# Patient Record
Sex: Female | Born: 1985 | Race: White | Hispanic: No | Marital: Married | Smoking: Former smoker
Health system: Southern US, Community
[De-identification: ages and names within clinical notes are randomized; demographics above are authoritative.]

## PROBLEM LIST (undated history)

## (undated) DIAGNOSIS — Z789 Other specified health status: Secondary | ICD-10-CM

## (undated) HISTORY — PX: HERNIA REPAIR: SHX51

## (undated) HISTORY — DX: Other specified health status: Z78.9

---
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
## 2012-11-26 ENCOUNTER — Other Ambulatory Visit: Payer: Self-pay | Admitting: Dermatology

## 2015-10-11 DIAGNOSIS — Z23 Encounter for immunization: Secondary | ICD-10-CM | POA: Diagnosis not present

## 2015-11-12 DIAGNOSIS — Z3201 Encounter for pregnancy test, result positive: Secondary | ICD-10-CM | POA: Diagnosis not present

## 2015-11-19 DIAGNOSIS — Z3201 Encounter for pregnancy test, result positive: Secondary | ICD-10-CM | POA: Diagnosis not present

## 2015-11-26 DIAGNOSIS — Z3689 Encounter for other specified antenatal screening: Secondary | ICD-10-CM | POA: Diagnosis not present

## 2015-11-26 DIAGNOSIS — Z3401 Encounter for supervision of normal first pregnancy, first trimester: Secondary | ICD-10-CM | POA: Diagnosis not present

## 2015-11-26 LAB — OB RESULTS CONSOLE ABO/RH: RH Type: POSITIVE

## 2015-11-26 LAB — OB RESULTS CONSOLE HIV ANTIBODY (ROUTINE TESTING): HIV: NONREACTIVE

## 2015-11-26 LAB — OB RESULTS CONSOLE HEPATITIS B SURFACE ANTIGEN: Hepatitis B Surface Ag: NEGATIVE

## 2015-11-26 LAB — OB RESULTS CONSOLE ANTIBODY SCREEN: Antibody Screen: NEGATIVE

## 2015-11-26 LAB — OB RESULTS CONSOLE RPR: RPR: NONREACTIVE

## 2015-11-26 LAB — OB RESULTS CONSOLE RUBELLA ANTIBODY, IGM: Rubella: IMMUNE

## 2015-12-11 DIAGNOSIS — Z3682 Encounter for antenatal screening for nuchal translucency: Secondary | ICD-10-CM | POA: Diagnosis not present

## 2015-12-11 DIAGNOSIS — Z3689 Encounter for other specified antenatal screening: Secondary | ICD-10-CM | POA: Diagnosis not present

## 2015-12-11 DIAGNOSIS — Z3491 Encounter for supervision of normal pregnancy, unspecified, first trimester: Secondary | ICD-10-CM | POA: Diagnosis not present

## 2015-12-11 DIAGNOSIS — Z3401 Encounter for supervision of normal first pregnancy, first trimester: Secondary | ICD-10-CM | POA: Diagnosis not present

## 2015-12-11 DIAGNOSIS — Z36 Encounter for antenatal screening for chromosomal anomalies: Secondary | ICD-10-CM | POA: Diagnosis not present

## 2015-12-11 LAB — OB RESULTS CONSOLE GC/CHLAMYDIA
Chlamydia: NEGATIVE
Gonorrhea: NEGATIVE

## 2015-12-21 DIAGNOSIS — A630 Anogenital (venereal) warts: Secondary | ICD-10-CM | POA: Diagnosis not present

## 2015-12-21 DIAGNOSIS — N898 Other specified noninflammatory disorders of vagina: Secondary | ICD-10-CM | POA: Diagnosis not present

## 2015-12-21 DIAGNOSIS — Z3682 Encounter for antenatal screening for nuchal translucency: Secondary | ICD-10-CM | POA: Diagnosis not present

## 2016-01-18 DIAGNOSIS — Z361 Encounter for antenatal screening for raised alphafetoprotein level: Secondary | ICD-10-CM | POA: Diagnosis not present

## 2016-01-22 DIAGNOSIS — B009 Herpesviral infection, unspecified: Secondary | ICD-10-CM | POA: Diagnosis not present

## 2016-02-03 DIAGNOSIS — Z3401 Encounter for supervision of normal first pregnancy, first trimester: Secondary | ICD-10-CM | POA: Diagnosis not present

## 2016-02-03 DIAGNOSIS — Z363 Encounter for antenatal screening for malformations: Secondary | ICD-10-CM | POA: Diagnosis not present

## 2016-02-24 DIAGNOSIS — Z3A20 20 weeks gestation of pregnancy: Secondary | ICD-10-CM | POA: Diagnosis not present

## 2016-02-24 DIAGNOSIS — O26892 Other specified pregnancy related conditions, second trimester: Secondary | ICD-10-CM | POA: Diagnosis not present

## 2016-03-03 DIAGNOSIS — A630 Anogenital (venereal) warts: Secondary | ICD-10-CM | POA: Diagnosis not present

## 2016-04-06 DIAGNOSIS — O4402 Placenta previa specified as without hemorrhage, second trimester: Secondary | ICD-10-CM | POA: Diagnosis not present

## 2016-04-06 DIAGNOSIS — Z3689 Encounter for other specified antenatal screening: Secondary | ICD-10-CM | POA: Diagnosis not present

## 2016-04-06 DIAGNOSIS — Z3A26 26 weeks gestation of pregnancy: Secondary | ICD-10-CM | POA: Diagnosis not present

## 2016-04-13 DIAGNOSIS — Z3482 Encounter for supervision of other normal pregnancy, second trimester: Secondary | ICD-10-CM | POA: Diagnosis not present

## 2016-04-13 DIAGNOSIS — Z3483 Encounter for supervision of other normal pregnancy, third trimester: Secondary | ICD-10-CM | POA: Diagnosis not present

## 2016-04-19 DIAGNOSIS — O4403 Placenta previa specified as without hemorrhage, third trimester: Secondary | ICD-10-CM | POA: Diagnosis not present

## 2016-04-19 DIAGNOSIS — Z3689 Encounter for other specified antenatal screening: Secondary | ICD-10-CM | POA: Diagnosis not present

## 2016-04-19 DIAGNOSIS — Z3A28 28 weeks gestation of pregnancy: Secondary | ICD-10-CM | POA: Diagnosis not present

## 2016-04-19 DIAGNOSIS — Z23 Encounter for immunization: Secondary | ICD-10-CM | POA: Diagnosis not present

## 2016-05-04 DIAGNOSIS — Z3A30 30 weeks gestation of pregnancy: Secondary | ICD-10-CM | POA: Diagnosis not present

## 2016-05-04 DIAGNOSIS — O4403 Placenta previa specified as without hemorrhage, third trimester: Secondary | ICD-10-CM | POA: Diagnosis not present

## 2016-05-17 DIAGNOSIS — Z3A32 32 weeks gestation of pregnancy: Secondary | ICD-10-CM | POA: Diagnosis not present

## 2016-05-17 DIAGNOSIS — O4403 Placenta previa specified as without hemorrhage, third trimester: Secondary | ICD-10-CM | POA: Diagnosis not present

## 2016-06-02 DIAGNOSIS — Z3A34 34 weeks gestation of pregnancy: Secondary | ICD-10-CM | POA: Diagnosis not present

## 2016-06-02 DIAGNOSIS — O4403 Placenta previa specified as without hemorrhage, third trimester: Secondary | ICD-10-CM | POA: Diagnosis not present

## 2016-06-09 DIAGNOSIS — Z3403 Encounter for supervision of normal first pregnancy, third trimester: Secondary | ICD-10-CM | POA: Diagnosis not present

## 2016-06-09 DIAGNOSIS — O4403 Placenta previa specified as without hemorrhage, third trimester: Secondary | ICD-10-CM | POA: Diagnosis not present

## 2016-06-09 DIAGNOSIS — Z3685 Encounter for antenatal screening for Streptococcus B: Secondary | ICD-10-CM | POA: Diagnosis not present

## 2016-06-09 DIAGNOSIS — Z3A35 35 weeks gestation of pregnancy: Secondary | ICD-10-CM | POA: Diagnosis not present

## 2016-06-16 DIAGNOSIS — Z3A36 36 weeks gestation of pregnancy: Secondary | ICD-10-CM | POA: Diagnosis not present

## 2016-06-16 DIAGNOSIS — O4403 Placenta previa specified as without hemorrhage, third trimester: Secondary | ICD-10-CM | POA: Diagnosis not present

## 2016-06-24 DIAGNOSIS — O4403 Placenta previa specified as without hemorrhage, third trimester: Secondary | ICD-10-CM | POA: Diagnosis not present

## 2016-06-24 DIAGNOSIS — Z3A37 37 weeks gestation of pregnancy: Secondary | ICD-10-CM | POA: Diagnosis not present

## 2016-06-29 DIAGNOSIS — Z3A38 38 weeks gestation of pregnancy: Secondary | ICD-10-CM | POA: Diagnosis not present

## 2016-06-29 DIAGNOSIS — O4403 Placenta previa specified as without hemorrhage, third trimester: Secondary | ICD-10-CM | POA: Diagnosis not present

## 2016-07-06 ENCOUNTER — Inpatient Hospital Stay (HOSPITAL_COMMUNITY)
Admission: AD | Admit: 2016-07-06 | Discharge: 2016-07-10 | Disposition: A | Discharge status: Home / Self Care | Payer: BLUE CROSS/BLUE SHIELD | Source: Ambulatory Visit | Attending: Obstetrics and Gynecology | Admitting: Obstetrics and Gynecology

## 2016-07-06 ENCOUNTER — Other Ambulatory Visit: Payer: Self-pay | Admitting: Obstetrics and Gynecology

## 2016-07-06 ENCOUNTER — Inpatient Hospital Stay (HOSPITAL_COMMUNITY)
Admission: AD | Admit: 2016-07-06 | Discharge: 2016-07-06 | DRG: 765 | Disposition: A | Discharge status: Home / Self Care | Payer: BLUE CROSS/BLUE SHIELD | Source: Ambulatory Visit | Attending: Obstetrics and Gynecology | Admitting: Obstetrics and Gynecology

## 2016-07-06 DIAGNOSIS — Z880 Allergy status to penicillin: Secondary | ICD-10-CM | POA: Diagnosis not present

## 2016-07-06 DIAGNOSIS — Z3A39 39 weeks gestation of pregnancy: Secondary | ICD-10-CM

## 2016-07-06 DIAGNOSIS — O99214 Obesity complicating childbirth: Secondary | ICD-10-CM | POA: Diagnosis not present

## 2016-07-06 DIAGNOSIS — D500 Iron deficiency anemia secondary to blood loss (chronic): Secondary | ICD-10-CM | POA: Diagnosis not present

## 2016-07-06 DIAGNOSIS — Z23 Encounter for immunization: Secondary | ICD-10-CM | POA: Diagnosis not present

## 2016-07-06 DIAGNOSIS — Z3493 Encounter for supervision of normal pregnancy, unspecified, third trimester: Secondary | ICD-10-CM | POA: Diagnosis not present

## 2016-07-06 DIAGNOSIS — Z6841 Body Mass Index (BMI) 40.0 and over, adult: Secondary | ICD-10-CM

## 2016-07-06 DIAGNOSIS — O9902 Anemia complicating childbirth: Principal | ICD-10-CM | POA: Diagnosis present

## 2016-07-06 DIAGNOSIS — Z3490 Encounter for supervision of normal pregnancy, unspecified, unspecified trimester: Secondary | ICD-10-CM

## 2016-07-06 LAB — TYPE AND SCREEN
ABO/RH(D): O POS
Antibody Screen: NEGATIVE
Antibody Screen: NEGATIVE
Antibody Screen: NEGATIVE
Antibody Screen: NEGATIVE
Antibody Screen: NEGATIVE

## 2016-07-06 LAB — CBC
HCT: 39.2 % (ref 36.0–46.0)
Hemoglobin: 13.1 g/dL (ref 12.0–15.0)
MCH: 33.2 pg (ref 26.0–34.0)
MCHC: 33.4 g/dL (ref 30.0–36.0)
MCV: 99.2 fL (ref 78.0–100.0)
Platelets: 185 10*3/uL (ref 150–400)
RBC: 3.95 MIL/uL (ref 3.87–5.11)
RDW: 14.2 % (ref 11.5–15.5)
WBC: 12.1 10*3/uL — ABNORMAL HIGH (ref 4.0–10.5)

## 2016-07-06 LAB — OB RESULTS CONSOLE GBS: GBS: POSITIVE

## 2016-07-06 LAB — POCT FERN TEST: POCT Fern Test: POSITIVE

## 2016-07-06 MED ORDER — LACTATED RINGERS IV SOLN
INTRAVENOUS | Status: DC
  Administered 2016-07-06 – 2016-07-07 (×5): via INTRAVENOUS

## 2016-07-06 MED ORDER — FLEET ENEMA 7-19 GM/118ML RE ENEM: 1.0000 | ENEMA | RECTAL | Status: DC | PRN

## 2016-07-06 MED ORDER — SOD CITRATE-CITRIC ACID 500-334 MG/5ML PO SOLN
30.0000 mL | ORAL | Status: DC | PRN
Start: 2016-07-06 — End: 2016-07-06

## 2016-07-06 MED ORDER — LIDOCAINE HCL (PF) 1 % IJ SOLN: 30.0000 mL | INTRAMUSCULAR | Status: DC | PRN

## 2016-07-06 MED ORDER — CEFAZOLIN SODIUM-DEXTROSE 1-4 GM/50ML-% IV SOLN: 1.0000 g | Freq: Three times a day (TID) | INTRAVENOUS | Status: DC

## 2016-07-06 MED ORDER — TERBUTALINE SULFATE 1 MG/ML IJ SOLN: 0.2500 mg | Freq: Once | INTRAMUSCULAR | Status: DC | PRN

## 2016-07-06 MED ORDER — OXYTOCIN BOLUS FROM INFUSION: 500.0000 mL | Freq: Once | INTRAVENOUS | Status: DC

## 2016-07-06 MED ORDER — OXYCODONE-ACETAMINOPHEN 5-325 MG PO TABS: 2.0000 | ORAL_TABLET | ORAL | Status: DC | PRN

## 2016-07-06 MED ORDER — CEFAZOLIN SODIUM-DEXTROSE 2-4 GM/100ML-% IV SOLN
2.0000 g | Freq: Once | INTRAVENOUS | Status: AC
  Administered 2016-07-07: 2 g via INTRAVENOUS
  Filled 2016-07-06: qty 100

## 2016-07-06 MED ORDER — ONDANSETRON HCL 4 MG/2ML IJ SOLN: 4.0000 mg | Freq: Four times a day (QID) | INTRAMUSCULAR | Status: DC | PRN

## 2016-07-06 MED ORDER — SOD CITRATE-CITRIC ACID 500-334 MG/5ML PO SOLN
30.0000 mL | ORAL | Status: DC | PRN
  Administered 2016-07-07: 30 mL via ORAL
  Filled 2016-07-06: qty 15

## 2016-07-06 MED ORDER — OXYTOCIN 40 UNITS IN LACTATED RINGERS INFUSION - SIMPLE MED: 2.5000 [IU]/h | INTRAVENOUS | Status: DC

## 2016-07-06 MED ORDER — LACTATED RINGERS IV SOLN: 500.0000 mL | INTRAVENOUS | Status: DC | PRN

## 2016-07-06 MED ORDER — SOD CITRATE-CITRIC ACID 500-334 MG/5ML PO SOLN: 30.0000 mL | ORAL | Status: DC | PRN

## 2016-07-06 MED ORDER — OXYCODONE-ACETAMINOPHEN 5-325 MG PO TABS
2.0000 | ORAL_TABLET | ORAL | Status: DC | PRN
Start: 2016-07-06 — End: 2016-07-06

## 2016-07-06 MED ORDER — ACETAMINOPHEN 325 MG PO TABS: 650.0000 mg | ORAL_TABLET | ORAL | Status: DC | PRN

## 2016-07-06 MED ORDER — TERBUTALINE SULFATE 1 MG/ML IJ SOLN
0.2500 mg | Freq: Once | INTRAMUSCULAR | Status: DC | PRN
  Filled 2016-07-06: qty 1

## 2016-07-06 MED ORDER — LACTATED RINGERS IV SOLN
500.0000 mL | INTRAVENOUS | Status: DC | PRN
  Administered 2016-07-07: 500 mL via INTRAVENOUS

## 2016-07-06 MED ORDER — OXYTOCIN 40 UNITS IN LACTATED RINGERS INFUSION - SIMPLE MED: 1.0000 m[IU]/min | INTRAVENOUS | Status: DC

## 2016-07-06 MED ORDER — OXYTOCIN 40 UNITS IN LACTATED RINGERS INFUSION - SIMPLE MED
INTRAVENOUS | Status: AC
  Filled 2016-07-06: qty 1000

## 2016-07-06 MED ORDER — OXYCODONE-ACETAMINOPHEN 5-325 MG PO TABS: 1.0000 | ORAL_TABLET | ORAL | Status: DC | PRN

## 2016-07-06 MED ORDER — CEFAZOLIN SODIUM-DEXTROSE 2-4 GM/100ML-% IV SOLN: 2.0000 g | Freq: Once | INTRAVENOUS | Status: DC

## 2016-07-06 MED ORDER — CEFAZOLIN SODIUM-DEXTROSE 1-4 GM/50ML-% IV SOLN
1.0000 g | Freq: Three times a day (TID) | INTRAVENOUS | Status: DC
  Administered 2016-07-07: 1 g via INTRAVENOUS
  Filled 2016-07-06 (×2): qty 50

## 2016-07-06 MED ORDER — CLINDAMYCIN PHOSPHATE 900 MG/50ML IV SOLN: 900.0000 mg | Freq: Three times a day (TID) | INTRAVENOUS | Status: DC

## 2016-07-06 MED ORDER — LACTATED RINGERS IV SOLN: INTRAVENOUS | Status: DC

## 2016-07-06 MED ORDER — OXYCODONE-ACETAMINOPHEN 5-325 MG PO TABS
1.0000 | ORAL_TABLET | ORAL | Status: DC | PRN
Start: 2016-07-06 — End: 2016-07-06

## 2016-07-06 MED ORDER — LACTATED RINGERS IV SOLN
INTRAVENOUS | Status: DC
  Administered 2016-07-06: 22:00:00 via INTRAVENOUS

## 2016-07-06 MED ORDER — OXYTOCIN 40 UNITS IN LACTATED RINGERS INFUSION - SIMPLE MED
1.0000 m[IU]/min | INTRAVENOUS | Status: DC
  Administered 2016-07-06: 1 m[IU]/min via INTRAVENOUS

## 2016-07-06 NOTE — Progress Notes (Addendum)
G1@ [redacted] wksga. Presents to triage for r/o SROM @ 1900. Clear. Denies bleeding. + FM. EFM applied. SVE 1.5/thick/ballotable  2129: Provider notified. Report status of pt given. Orders received to admit pt with routine orders. GBS+ with amoxicillin allergy. Ordered for Clindamycin 900 mg q8hrs.   2142: Birthing charge nurse notified. Report status of pt given. Room assigned to 168  2150: Labs and IV started.   2200: pt to birthing suite via wheelchair taken by RN.   *Web designerBirthing Charge nurse made aware of merge of two records on this patient and aware pt is G1P0. Informed that registrar aware of issue and will resolve record mix up tomorrow with CC technician.

## 2016-07-06 NOTE — H&P (Signed)
Megan Bowman is a 31 y.o. female presenting for SROM at [id number]. OB History    Gravida Para Term Preterm AB Living   2 1 1  0 0 0   SAB TAB Ectopic Multiple Live Births   0 0 0 0 0     Past Medical History:  Diagnosis Date  . Depression   . History of shingles   . Kidney stone    Past Surgical History:  Procedure Laterality Date  . NO PAST SURGERIES     Family History: family history is not on file. Social History:  reports that she quit smoking about 3 years ago. She smoked 0.25 packs per day. She has never used smokeless tobacco. She reports that she does not drink alcohol or use drugs.     Maternal Diabetes: No Genetic Screening: Normal Maternal Ultrasounds/Referrals: Normal Fetal Ultrasounds or other Referrals:  None Maternal Substance Abuse:  No Significant Maternal Medications:  None Significant Maternal Lab Results:  None GBS pos Other Comments:  None  Review of Systems  Constitutional: Negative.   All other systems reviewed and are negative.  Maternal Medical History:  Reason for admission: Rupture of membranes.   Contractions: Onset was 1-2 hours ago.   Frequency: regular and rare.   Perceived severity is mild.    Fetal activity: Perceived fetal activity is normal.   Last perceived fetal movement was within the past hour.    Prenatal complications: no prenatal complications Prenatal Complications - Diabetes: none.    Dilation: 1.5 Effacement (%): 40 Station: [place], -3 Exam by:: Megan DeanJaneen McClellan, RN Blood pressure 136/86, pulse (!) 121, temperature 99.1 F (37.3 C), temperature source Oral, resp. rate 18, unknown if currently breastfeeding. Maternal Exam:  Uterine Assessment: Contraction strength is mild.  Contraction frequency is rare.   Abdomen: Patient reports no abdominal tenderness. Fetal presentation: vertex  Introitus: Normal vulva. Normal vagina.  Ferning test: positive.  Nitrazine test: positive. Amniotic fluid character:  clear.  Pelvis: questionable for delivery.   Cervix: Cervix evaluated by digital exam.     Physical Exam  Nursing note and vitals reviewed. Constitutional: She is oriented to person, place, and time. She appears well-developed and well-nourished.  HENT:  Head: Normocephalic and atraumatic.  Eyes: Conjunctivae and EOM are normal. Pupils are equal, round, and reactive to light.  Neck: Normal range of motion. Neck supple.  Cardiovascular: Normal rate and regular rhythm.   Respiratory: Effort normal and breath sounds normal.  GI: Soft. Bowel sounds are normal.  Genitourinary: Vagina normal and uterus normal.  Musculoskeletal: Normal range of motion.  Neurological: She is alert and oriented to person, place, and time. She has normal reflexes.  Skin: Skin is warm and dry.  Psychiatric: She has a normal mood and affect.    Prenatal labs: ABO, Rh:   Antibody:   Rubella:   RPR:    HBsAg:    HIV:    GBS:     Assessment/Plan: TERm IUP SROM GBS positive   Megan Bowman J 07/06/2016, 9:42 PM

## 2016-07-07 ENCOUNTER — Encounter (HOSPITAL_COMMUNITY)
Admission: AD | Disposition: A | Discharge status: Home / Self Care | Payer: Self-pay | Source: Ambulatory Visit | Attending: Obstetrics and Gynecology

## 2016-07-07 ENCOUNTER — Inpatient Hospital Stay (HOSPITAL_COMMUNITY): Payer: BLUE CROSS/BLUE SHIELD | Admitting: Anesthesiology

## 2016-07-07 ENCOUNTER — Encounter (HOSPITAL_COMMUNITY): Payer: Self-pay

## 2016-07-07 ENCOUNTER — Other Ambulatory Visit: Payer: Self-pay | Admitting: Obstetrics and Gynecology

## 2016-07-07 LAB — TYPE AND SCREEN
ABO/RH(D): O POS
ABO/RH(D): O POS
ABO/RH(D): O POS
ABO/RH(D): O POS
ABO/RH(D): O POS
ABO/RH(D): O POS
Antibody Screen: NEGATIVE
Antibody Screen: NEGATIVE
Antibody Screen: NEGATIVE
Antibody Screen: NEGATIVE
Antibody Screen: NEGATIVE
Antibody Screen: NEGATIVE
Antibody Screen: NEGATIVE
Antibody Screen: NEGATIVE

## 2016-07-07 LAB — RPR
RPR Ser Ql: NONREACTIVE
RPR Ser Ql: NONREACTIVE

## 2016-07-07 LAB — CBC
HCT: 37.9 % (ref 36.0–46.0)
Hemoglobin: 12.9 g/dL (ref 12.0–15.0)
MCH: 33.5 pg (ref 26.0–34.0)
MCHC: 34 g/dL (ref 30.0–36.0)
MCV: 98.4 fL (ref 78.0–100.0)
Platelets: 146 10*3/uL — ABNORMAL LOW (ref 150–400)
RBC: 3.85 MIL/uL — ABNORMAL LOW (ref 3.87–5.11)
RDW: 14.5 % (ref 11.5–15.5)
WBC: 18.4 10*3/uL — ABNORMAL HIGH (ref 4.0–10.5)

## 2016-07-07 LAB — ABO/RH: ABO/RH(D): O POS

## 2016-07-07 SURGERY — Surgical Case
Anesthesia: Epidural | Site: Abdomen | Wound class: Clean Contaminated

## 2016-07-07 MED ORDER — SODIUM CHLORIDE 0.9% FLUSH: 3.0000 mL | INTRAVENOUS | Status: DC | PRN

## 2016-07-07 MED ORDER — BUPIVACAINE HCL (PF) 0.25 % IJ SOLN
INTRAMUSCULAR | Status: DC | PRN
  Administered 2016-07-07: 30 mL

## 2016-07-07 MED ORDER — EPHEDRINE 5 MG/ML INJ: 10.0000 mg | INTRAVENOUS | Status: DC | PRN

## 2016-07-07 MED ORDER — SIMETHICONE 80 MG PO CHEW
80.0000 mg | CHEWABLE_TABLET | ORAL | Status: DC
Start: 2016-07-08 — End: 2016-07-10
  Administered 2016-07-08 – 2016-07-09 (×3): 80 mg via ORAL
  Filled 2016-07-07 (×3): qty 1

## 2016-07-07 MED ORDER — BUPIVACAINE HCL (PF) 0.25 % IJ SOLN
INTRAMUSCULAR | Status: DC | PRN
  Administered 2016-07-07 (×2): 5 mL via EPIDURAL

## 2016-07-07 MED ORDER — NALOXONE HCL 0.4 MG/ML IJ SOLN: 0.4000 mg | INTRAMUSCULAR | Status: DC | PRN

## 2016-07-07 MED ORDER — COCONUT OIL OIL
1.0000 "application " | TOPICAL_OIL | Status: DC | PRN
  Filled 2016-07-07: qty 120

## 2016-07-07 MED ORDER — HYDROMORPHONE HCL 1 MG/ML IJ SOLN: 0.2500 mg | INTRAMUSCULAR | Status: DC | PRN

## 2016-07-07 MED ORDER — METHYLERGONOVINE MALEATE 0.2 MG PO TABS: 0.2000 mg | ORAL_TABLET | ORAL | Status: DC | PRN

## 2016-07-07 MED ORDER — FENTANYL CITRATE (PF) 100 MCG/2ML IJ SOLN
100.0000 ug | Freq: Once | INTRAMUSCULAR | Status: AC
  Administered 2016-07-07: 100 ug via INTRAVENOUS
  Filled 2016-07-07: qty 2

## 2016-07-07 MED ORDER — TETANUS-DIPHTH-ACELL PERTUSSIS 5-2.5-18.5 LF-MCG/0.5 IM SUSP: 0.5000 mL | Freq: Once | INTRAMUSCULAR | Status: DC

## 2016-07-07 MED ORDER — OXYTOCIN 40 UNITS IN LACTATED RINGERS INFUSION - SIMPLE MED
2.5000 [IU]/h | INTRAVENOUS | Status: AC
  Administered 2016-07-07: 2.5 [IU]/h via INTRAVENOUS
  Filled 2016-07-07: qty 1000

## 2016-07-07 MED ORDER — SIMETHICONE 80 MG PO CHEW
80.0000 mg | CHEWABLE_TABLET | Freq: Three times a day (TID) | ORAL | Status: DC
  Administered 2016-07-08 – 2016-07-10 (×6): 80 mg via ORAL
  Filled 2016-07-07 (×5): qty 1

## 2016-07-07 MED ORDER — PHENYLEPHRINE 40 MCG/ML (10ML) SYRINGE FOR IV PUSH (FOR BLOOD PRESSURE SUPPORT)
PREFILLED_SYRINGE | INTRAVENOUS | Status: AC
  Administered 2016-07-07: 80 ug via INTRAVENOUS
  Filled 2016-07-07: qty 20

## 2016-07-07 MED ORDER — PHENYLEPHRINE HCL 10 MG/ML IJ SOLN
INTRAMUSCULAR | Status: DC | PRN
  Administered 2016-07-07 (×2): 80 ug via INTRAVENOUS

## 2016-07-07 MED ORDER — FENTANYL 2.5 MCG/ML BUPIVACAINE 1/10 % EPIDURAL INFUSION (WH - ANES)
14.0000 mL/h | INTRAMUSCULAR | Status: DC | PRN
Start: 2016-07-07 — End: 2016-07-07
  Administered 2016-07-07: 14 mL/h via EPIDURAL

## 2016-07-07 MED ORDER — ONDANSETRON HCL 4 MG/2ML IJ SOLN
INTRAMUSCULAR | Status: DC | PRN
  Administered 2016-07-07: 4 mg via INTRAVENOUS

## 2016-07-07 MED ORDER — KETOROLAC TROMETHAMINE 30 MG/ML IJ SOLN: 30.0000 mg | Freq: Four times a day (QID) | INTRAMUSCULAR | Status: DC | PRN

## 2016-07-07 MED ORDER — ONDANSETRON HCL 4 MG/2ML IJ SOLN: 4.0000 mg | Freq: Three times a day (TID) | INTRAMUSCULAR | Status: DC | PRN

## 2016-07-07 MED ORDER — LACTATED RINGERS IV SOLN
INTRAVENOUS | Status: DC
  Administered 2016-07-07: 22:00:00 via INTRAVENOUS

## 2016-07-07 MED ORDER — SCOPOLAMINE 1 MG/3DAYS TD PT72SCOPOLAMINE 1 MG/3DAYS
MEDICATED_PATCH | TRANSDERMAL | Status: DC | PRN
Start: 2016-07-07 — End: 2016-07-07
  Administered 2016-07-07: 1 via TRANSDERMAL

## 2016-07-07 MED ORDER — LIDOCAINE-EPINEPHRINE (PF) 2 %-1:200000 IJ SOLN
INTRAMUSCULAR | Status: DC | PRN
  Administered 2016-07-07 (×2): 5 mL via INTRADERMAL

## 2016-07-07 MED ORDER — DIPHENHYDRAMINE HCL 50 MG/ML IJ SOLN: 12.5000 mg | INTRAMUSCULAR | Status: DC | PRN

## 2016-07-07 MED ORDER — PHENYLEPHRINE 40 MCG/ML (10ML) SYRINGE FOR IV PUSH (FOR BLOOD PRESSURE SUPPORT)
80.0000 ug | PREFILLED_SYRINGE | INTRAVENOUS | Status: DC | PRN
  Administered 2016-07-07: 80 ug via INTRAVENOUS

## 2016-07-07 MED ORDER — SIMETHICONE 80 MG PO CHEW: 80.0000 mg | CHEWABLE_TABLET | ORAL | Status: DC | PRN

## 2016-07-07 MED ORDER — NALBUPHINE HCL 10 MG/ML IJ SOLN: 5.0000 mg | Freq: Once | INTRAMUSCULAR | Status: DC | PRN

## 2016-07-07 MED ORDER — ZOLPIDEM TARTRATE 5 MG PO TABS: 5.0000 mg | ORAL_TABLET | Freq: Every evening | ORAL | Status: DC | PRN

## 2016-07-07 MED ORDER — NALBUPHINE HCL 10 MG/ML IJ SOLN: 5.0000 mg | INTRAMUSCULAR | Status: DC | PRN

## 2016-07-07 MED ORDER — MEPERIDINE HCL 25 MG/ML IJ SOLN: 6.2500 mg | INTRAMUSCULAR | Status: DC | PRN

## 2016-07-07 MED ORDER — DIBUCAINE 1 % RE OINT: 1.0000 "application " | TOPICAL_OINTMENT | RECTAL | Status: DC | PRN

## 2016-07-07 MED ORDER — BUPIVACAINE HCL (PF) 0.25 % IJ SOLN
INTRAMUSCULAR | Status: AC
  Filled 2016-07-07: qty 30

## 2016-07-07 MED ORDER — ACETAMINOPHEN 325 MG PO TABS
650.0000 mg | ORAL_TABLET | ORAL | Status: DC | PRN
Start: 2016-07-07 — End: 2016-07-10

## 2016-07-07 MED ORDER — FENTANYL 2.5 MCG/ML BUPIVACAINE 1/10 % EPIDURAL INFUSION (WH - ANES)
INTRAMUSCULAR | Status: AC
  Filled 2016-07-07: qty 100

## 2016-07-07 MED ORDER — MENTHOL 3 MG MT LOZG: 1.0000 | LOZENGE | OROMUCOSAL | Status: DC | PRN

## 2016-07-07 MED ORDER — OXYCODONE-ACETAMINOPHEN 5-325 MG PO TABS
1.0000 | ORAL_TABLET | ORAL | Status: DC | PRN
  Administered 2016-07-08 – 2016-07-09 (×5): 1 via ORAL
  Filled 2016-07-07 (×5): qty 1

## 2016-07-07 MED ORDER — PRENATAL MULTIVITAMIN CH
1.0000 | ORAL_TABLET | Freq: Every day | ORAL | Status: DC
  Administered 2016-07-08 – 2016-07-09 (×2): 1 via ORAL
  Filled 2016-07-07 (×2): qty 1

## 2016-07-07 MED ORDER — LIDOCAINE HCL (PF) 1 % IJ SOLN
INTRAMUSCULAR | Status: DC | PRN
  Administered 2016-07-07 (×2): 7 mL via EPIDURAL

## 2016-07-07 MED ORDER — KETOROLAC TROMETHAMINE 30 MG/ML IJ SOLN
INTRAMUSCULAR | Status: AC
  Filled 2016-07-07: qty 1

## 2016-07-07 MED ORDER — DIPHENHYDRAMINE HCL 25 MG PO CAPS
25.0000 mg | ORAL_CAPSULE | ORAL | Status: DC | PRN
  Filled 2016-07-07: qty 1

## 2016-07-07 MED ORDER — METHYLERGONOVINE MALEATE 0.2 MG/ML IJ SOLN: 0.2000 mg | INTRAMUSCULAR | Status: DC | PRN

## 2016-07-07 MED ORDER — SENNOSIDES-DOCUSATE SODIUM 8.6-50 MG PO TABS
2.0000 | ORAL_TABLET | ORAL | Status: DC
  Administered 2016-07-08 – 2016-07-09 (×3): 2 via ORAL
  Filled 2016-07-07 (×3): qty 2

## 2016-07-07 MED ORDER — DIPHENHYDRAMINE HCL 25 MG PO CAPS: 25.0000 mg | ORAL_CAPSULE | Freq: Four times a day (QID) | ORAL | Status: DC | PRN

## 2016-07-07 MED ORDER — MORPHINE SULFATE (PF) 0.5 MG/ML IJ SOLN
INTRAMUSCULAR | Status: DC | PRN
  Administered 2016-07-07: 1 mg via EPIDURAL
  Administered 2016-07-07: 4 mg via EPIDURAL

## 2016-07-07 MED ORDER — FENTANYL CITRATE (PF) 100 MCG/2ML IJ SOLN
INTRAMUSCULAR | Status: DC | PRN
  Administered 2016-07-07 (×2): 50 ug via INTRAVENOUS
  Administered 2016-07-07 (×2): 50 ug via EPIDURAL

## 2016-07-07 MED ORDER — OXYCODONE-ACETAMINOPHEN 5-325 MG PO TABS: 2.0000 | ORAL_TABLET | ORAL | Status: DC | PRN

## 2016-07-07 MED ORDER — WITCH HAZEL-GLYCERIN EX PADS: 1.0000 "application " | MEDICATED_PAD | CUTANEOUS | Status: DC | PRN

## 2016-07-07 MED ORDER — DEXAMETHASONE SODIUM PHOSPHATE 4 MG/ML IJ SOLN
INTRAMUSCULAR | Status: DC | PRN
  Administered 2016-07-07: 4 mg via INTRAVENOUS

## 2016-07-07 MED ORDER — IBUPROFEN 600 MG PO TABS
600.0000 mg | ORAL_TABLET | Freq: Four times a day (QID) | ORAL | Status: DC
  Administered 2016-07-08 – 2016-07-10 (×11): 600 mg via ORAL
  Filled 2016-07-07 (×11): qty 1

## 2016-07-07 MED ORDER — SODIUM CHLORIDE 0.9 % IJ SOLN
INTRAMUSCULAR | Status: DC | PRN
  Administered 2016-07-07: 1000 mL

## 2016-07-07 MED ORDER — PHENYLEPHRINE 40 MCG/ML (10ML) SYRINGE FOR IV PUSH (FOR BLOOD PRESSURE SUPPORT): 80.0000 ug | PREFILLED_SYRINGE | INTRAVENOUS | Status: DC | PRN

## 2016-07-07 MED ORDER — KETOROLAC TROMETHAMINE 30 MG/ML IJ SOLN
30.0000 mg | Freq: Four times a day (QID) | INTRAMUSCULAR | Status: DC | PRN
  Administered 2016-07-07: 30 mg via INTRAVENOUS

## 2016-07-07 MED ORDER — LACTATED RINGERS IV SOLN
500.0000 mL | Freq: Once | INTRAVENOUS | Status: AC
  Administered 2016-07-07: 500 mL via INTRAVENOUS

## 2016-07-07 MED ORDER — SCOPOLAMINE 1 MG/3DAYS TD PT72: 1.0000 | MEDICATED_PATCH | Freq: Once | TRANSDERMAL | Status: DC

## 2016-07-07 MED ORDER — PROMETHAZINE HCL 25 MG/ML IJ SOLN: 6.2500 mg | INTRAMUSCULAR | Status: DC | PRN

## 2016-07-07 MED ORDER — CEFAZOLIN SODIUM-DEXTROSE 2-3 GM-% IV SOLR
INTRAVENOUS | Status: DC | PRN
  Administered 2016-07-07: 2 g via INTRAVENOUS

## 2016-07-07 MED ORDER — NALOXONE HCL 2 MG/2ML IJ SOSY
1.0000 ug/kg/h | PREFILLED_SYRINGE | INTRAMUSCULAR | Status: DC | PRN
  Filled 2016-07-07: qty 2

## 2016-07-07 SURGICAL SUPPLY — 34 items
BENZOIN TINCTURE PRP APPL 2/3 (GAUZE/BANDAGES/DRESSINGS) ×2 IMPLANT
CHLORAPREP W/TINT 26ML (MISCELLANEOUS) ×2 IMPLANT
CLAMP CORD UMBIL (MISCELLANEOUS) IMPLANT
CLOTH BEACON ORANGE TIMEOUT ST (SAFETY) ×2 IMPLANT
CONTAINER PREFILL 10% NBF 15ML (MISCELLANEOUS) IMPLANT
DRSG OPSITE POSTOP 4X10 (GAUZE/BANDAGES/DRESSINGS) ×2 IMPLANT
ELECT REM PT RETURN 9FT ADLT (ELECTROSURGICAL) ×2
ELECTRODE REM PT RTRN 9FT ADLT (ELECTROSURGICAL) ×1 IMPLANT
EXTRACTOR VACUUM M CUP 4 TUBE (SUCTIONS) IMPLANT
GLOVE BIO SURGEON STRL SZ7.5 (GLOVE) ×2 IMPLANT
GLOVE BIOGEL PI IND STRL 7.0 (GLOVE) ×1 IMPLANT
GLOVE BIOGEL PI INDICATOR 7.0 (GLOVE) ×1
GOWN STRL REUS W/TWL LRG LVL3 (GOWN DISPOSABLE) ×4 IMPLANT
KIT ABG SYR 3ML LUER SLIP (SYRINGE) IMPLANT
NEEDLE HYPO 22GX1.5 SAFETY (NEEDLE) ×2 IMPLANT
NEEDLE HYPO 25X5/8 SAFETYGLIDE (NEEDLE) IMPLANT
NEEDLE SPNL 20GX3.5 QUINCKE YW (NEEDLE) IMPLANT
NS IRRIG 1000ML POUR BTL (IV SOLUTION) ×2 IMPLANT
PACK C SECTION WH (CUSTOM PROCEDURE TRAY) ×2 IMPLANT
PENCIL SMOKE EVAC W/HOLSTER (ELECTROSURGICAL) ×2 IMPLANT
STRIP CLOSURE SKIN 1/2X4 (GAUZE/BANDAGES/DRESSINGS) ×2 IMPLANT
SUT MNCRL 0 VIOLET CTX 36 (SUTURE) ×2 IMPLANT
SUT MNCRL AB 3-0 PS2 27 (SUTURE) IMPLANT
SUT MON AB 2-0 CT1 27 (SUTURE) ×2 IMPLANT
SUT MON AB-0 CT1 36 (SUTURE) ×4 IMPLANT
SUT MONOCRYL 0 CTX 36 (SUTURE) ×2
SUT PLAIN 0 NONE (SUTURE) IMPLANT
SUT PLAIN 2 0 (SUTURE)
SUT PLAIN 2 0 XLH (SUTURE) IMPLANT
SUT PLAIN ABS 2-0 CT1 27XMFL (SUTURE) IMPLANT
SYR 20CC LL (SYRINGE) IMPLANT
SYR CONTROL 10ML LL (SYRINGE) ×2 IMPLANT
TOWEL OR 17X24 6PK STRL BLUE (TOWEL DISPOSABLE) ×2 IMPLANT
TRAY FOLEY BAG SILVER LF 14FR (SET/KITS/TRAYS/PACK) ×2 IMPLANT

## 2016-07-07 NOTE — Lactation Note (Addendum)
This note was copied from a baby's chart. Lactation Consultation Note  Patient Name: Megan Bowman [id number]'s Date: 07/07/2016 Reason for consult: Initial assessment   Initial assessment with mom of < 1 hour old infant. Infant STS with mom and cueing to feed. Assisted mom in latching infant to both breasts without success. Mom with large firm breasts, firm non compressible areola, flat nipple on the right that everts 1 cm with stimulation and inverted nipple on the left that does not evert with stimulation. Hand expressed and obtained 2 ml colostrum that was spoon fed to infant. Old blood noted in colostrum obtained.    Mom asked if she can try a NS, # 20 NS applied to right nipple, showed mom how to apply. Infant fed for about 15 minutes and then came off. Megan Bowman Blood is noted in NS when infant came off. Infant was noted to have swallows with feeding. NS then applied to left breast and infant latched to breast and was still feeding when LC left room.   Enc mom to feed infant STS 8-12 x in 24 hours at first feeding cues, using both breasts with each feeding. Enc mom to use NS with each feeding. Discussed with mom using breast shells and hand pump to assist with feedings. Hand pump and breast shells were left in PP room and Megan LineaKaren Kane, RN was informed of need for their use.   Reviewed hand expression, spoon feeding, colostrum, milk coming to volume, how to apply and clean NS, positioning, rusty pipe syndrome, and cluster feeding reviewed with parents. Feeding log given with instructions for use.   BF Resources Handout and LC Brochure given, mom informed of IP/OP services, BF Support Groups and LC phone #. Mom has a Medela pump at home. Enc mom to call out for feeding assistance as needed.   Report to Megan LineaKaren Kane, RN and Megan Hainesricia Glime, RN.   Maternal Data Formula Feeding for Exclusion: No Has patient been taught Hand Expression?: Yes Does the patient have breastfeeding experience prior to this  delivery?: No  Feeding Feeding Type: Breast Fed Length of feed: 15 min  LATCH Score/Interventions Latch: Grasps breast easily, tongue down, lips flanged, rhythmical sucking.  Audible Swallowing: Spontaneous and intermittent  Type of Nipple: Flat (right nipple falt, left inverted) Intervention(s): Shells;Hand pump  Comfort (Breast/Nipple): Soft / non-tender     Hold (Positioning): Full assist, staff holds infant at breast Intervention(s): Breastfeeding basics reviewed;Support Pillows;Position options;Skin to skin  LATCH Score: 7  Lactation Tools Discussed/Used Tools: Nipple Shields Nipple shield size: 20   Consult Status Consult Status: Follow-up Date: 07/08/16 Follow-up type: In-patient    Silas FloodSharon S Nyzier Boivin 07/07/2016, 1:22 PM

## 2016-07-07 NOTE — Anesthesia Procedure Notes (Signed)
Epidural Patient location during procedure: OB Start time: 07/07/2016 4:32 AM End time: 07/07/2016 4:36 AM  Staffing Anesthesiologist: Leilani AbleHATCHETT, Jaylianna Tatlock Performed: anesthesiologist   Preanesthetic Checklist Completed: patient identified, surgical consent, pre-op evaluation, timeout performed, IV checked, risks and benefits discussed and monitors and equipment checked  Epidural Patient position: sitting Prep: site prepped and draped and DuraPrep Patient monitoring: continuous pulse ox and blood pressure Approach: midline Location: L3-L4 Injection technique: LOR air  Needle:  Needle type: Tuohy  Needle gauge: 17 G Needle length: 9 cm and 9 Needle insertion depth: 7 cm Catheter type: closed end flexible Catheter size: 19 Gauge Catheter at skin depth: 12 cm Test dose: negative and Other  Assessment Sensory level: T9 Events: blood not aspirated, injection not painful, no injection resistance, negative IV test and no paresthesia  Additional Notes Reason for block:procedure for pain

## 2016-07-07 NOTE — Transfer of Care (Signed)
Immediate Anesthesia Transfer of Care Note  Patient: Megan PonderKimberly Canche  Procedure(s) Performed: Procedure(s): CESAREAN SECTION (N/A)  Patient Location: PACU  Anesthesia Type:Epidural  Level of Consciousness: awake, alert  and oriented  Airway & Oxygen Therapy: Patient Spontanous Breathing  Post-op Assessment: Report given to RN and Post -op Vital signs reviewed and stable  Post vital signs: Reviewed and stable  Last Vitals:  Vitals:   07/07/16 1030 07/07/16 1101  BP: 130/88 127/81  Pulse: (!) 124 (!) 110  Resp: 18   Temp:      Last Pain:  Vitals:   07/07/16 0830  TempSrc:   PainSc: 5          Complications: No apparent anesthesia complications

## 2016-07-07 NOTE — Anesthesia Postprocedure Evaluation (Signed)
Anesthesia Post Note  Patient: Megan Bowman  Procedure(s) Performed: Procedure(s) (LRB): CESAREAN SECTION (N/A)     Patient location during evaluation: Mother Baby Anesthesia Type: Epidural Level of consciousness: awake Pain management: pain level controlled Vital Signs Assessment: post-procedure vital signs reviewed and stable Respiratory status: spontaneous breathing Cardiovascular status: stable Postop Assessment: no headache, no backache, epidural receding, patient able to bend at knees, no signs of nausea or vomiting and adequate PO intake Anesthetic complications: no    Last Vitals:  Vitals:   07/07/16 1450 07/07/16 1604  BP: 118/78 129/84  Pulse: 93 (!) 103  Resp: 18 19  Temp: 36.4 C 36.7 C    Last Pain:  Vitals:   07/07/16 1604  TempSrc: Oral  PainSc:    Pain Goal:                 Izzy Doubek

## 2016-07-07 NOTE — Progress Notes (Signed)
Megan Bowman is a 31 y.o. G1P0 at 5142w5d by LMP admitted for active labor, rupture of membranes  Subjective: Uncomfortable  Objective: BP (!) 145/85   Pulse (!) 114   Temp 99.4 F (37.4 C) (Oral)   Resp 18   LMP 10/03/2015 (Exact Date)   SpO2 100%  No intake/output data recorded. No intake/output data recorded.  FHT:  FHR: 155 bpm, variability: moderate,  accelerations:  Present,  decelerations:  Absent UC:   regular, every 2-3 minutes SVE:   Dilation: 4.5 Effacement (%): 80 Station: -3 Exam by:: Dr. Billy Coastaavon  NO change x 6 hours  Labs: Lab Results  Component Value Date   WBC 12.1 (H) 07/06/2016   HGB 13.1 07/06/2016   HCT 39.2 07/06/2016   MCV 99.2 07/06/2016   PLT 185 07/06/2016    Assessment / Plan: Protracted active phase  Labor: no progress Preeclampsia:  no signs or symptoms of toxicity Fetal Wellbeing:  Category I Pain Control:  Epidural I/D:  n/a Anticipated MOD:  Proceed with csection. Discussion of risks vs benefits. Consent done.  Megan Bowman 07/07/2016, 10:48 AM

## 2016-07-07 NOTE — Addendum Note (Signed)
Addendum  created 07/07/16 1629 by Renford DillsMullins, Owin Vignola L, CRNA   Sign clinical note

## 2016-07-07 NOTE — Anesthesia Postprocedure Evaluation (Signed)
Anesthesia Post Note  Patient: Megan PonderKimberly Vancleve  Procedure(s) Performed: Procedure(s) (LRB): CESAREAN SECTION (N/A)     Patient location during evaluation: PACU Anesthesia Type: Epidural Level of consciousness: awake and alert Pain management: pain level controlled Vital Signs Assessment: post-procedure vital signs reviewed and stable Respiratory status: spontaneous breathing and respiratory function stable Cardiovascular status: blood pressure returned to baseline and stable Postop Assessment: spinal receding Anesthetic complications: no    Last Vitals:  Vitals:   07/07/16 1219 07/07/16 1324  BP:  116/87  Pulse:  (!) 102  Resp:  20  Temp: 37.1 C 36.8 C    Last Pain:  Vitals:   07/07/16 1324  TempSrc: Oral  PainSc:    Pain Goal:                 [name] DANIEL

## 2016-07-07 NOTE — Progress Notes (Signed)
Megan Bowman is a 31 y.o. G1P0 at 595w5d by LMP admitted for rupture of membranes  Subjective: comfortable  Objective: BP 139/78   Pulse (!) 123   Temp 99.4 F (37.4 C) (Oral)   Resp 18   LMP 10/03/2015 (Exact Date)   SpO2 100%  No intake/output data recorded. No intake/output data recorded.  FHT:  FHR: 155 bpm, variability: moderate,  accelerations:  Present,  decelerations:  Absent UC:   regular, every 2-4 minutes SVE:   Dilation: 4.5 Effacement (%): 80 Station: -3 Exam by:: Dr. Billy Coastaavon  IUPC placed without difficulty  Labs: Lab Results  Component Value Date   WBC 12.1 (H) 07/06/2016   HGB 13.1 07/06/2016   HCT 39.2 07/06/2016   MCV 99.2 07/06/2016   PLT 185 07/06/2016    Assessment / Plan: Protracted active phase  Labor: no change x 4hr Preeclampsia:  no signs or symptoms of toxicity Fetal Wellbeing:  Category I Pain Control:  Epidural I/D:  n/a Anticipated MOD:  Reassess 2 hrs after IUPC  Megan Bowman 07/07/2016, 8:40 AM

## 2016-07-07 NOTE — Anesthesia Preprocedure Evaluation (Signed)
Anesthesia Evaluation  Patient identified by MRN, date of birth, ID band Patient awake    Reviewed: Allergy & Precautions, H&P , NPO status , Patient's Chart, lab work & pertinent test results  Airway Mallampati: II  TM Distance: >3 FB Neck ROM: full    Dental no notable dental hx. (+) Teeth Intact   Pulmonary neg pulmonary ROS,    Pulmonary exam normal breath sounds clear to auscultation       Cardiovascular negative cardio ROS Normal cardiovascular exam Rhythm:regular Rate:Normal     Neuro/Psych negative neurological ROS  negative psych ROS   GI/Hepatic negative GI ROS, Neg liver ROS,   Endo/Other  Morbid obesity  Renal/GU negative Renal ROS  negative genitourinary   Musculoskeletal negative musculoskeletal ROS (+)   Abdominal (+) + obese,   Peds  Hematology negative hematology ROS (+)   Anesthesia Other Findings   Reproductive/Obstetrics (+) Pregnancy                             Anesthesia Physical Anesthesia Plan  ASA: III  Anesthesia Plan: Epidural   Post-op Pain Management:    Induction:   PONV Risk Score and Plan:   Airway Management Planned:   Additional Equipment:   Intra-op Plan:   Post-operative Plan:   Informed Consent: I have reviewed the patients History and Physical, chart, labs and discussed the procedure including the risks, benefits and alternatives for the proposed anesthesia with the patient or authorized representative who has indicated his/her understanding and acceptance.       Plan Discussed with:   Anesthesia Plan Comments:         Anesthesia Quick Evaluation  

## 2016-07-07 NOTE — Op Note (Signed)
Cesarean Section Procedure Note  Indications: failure to progress: arrest of dilation  Pre-operative Diagnosis: 39 week 5 day pregnancy.  Post-operative Diagnosis: same  Surgeon: Lenoard AdenAAVON,Reona Zendejas J   Assistants: Fredric MareBailey, CNM  Anesthesia: Epidural anesthesia and Local anesthesia 0.25.% bupivacaine  ASA Class: 2  Procedure Details  The patient was seen in the Holding Room. The risks, benefits, complications, treatment options, and expected outcomes were discussed with the patient.  The patient concurred with the proposed plan, giving informed consent. The risks of anesthesia, infection, bleeding and possible injury to other organs discussed. Injury to bowel, bladder, or ureter with possible need for repair discussed. Possible need for transfusion with secondary risks of hepatitis or HIV acquisition discussed. Post operative complications to include but not limited to DVT, PE and Pneumonia noted. The site of surgery properly noted/marked. The patient was taken to Operating Room # 1, identified as Megan Bowman and the procedure verified as C-Section Delivery. A Time Out was held and the above information confirmed.  After induction of anesthesia, the patient was draped and prepped in the usual sterile manner. A Pfannenstiel incision was made and carried down through the subcutaneous tissue to the fascia. Fascial incision was made and extended transversely using Mayo scissors. The fascia was separated from the underlying rectus tissue superiorly and inferiorly. The peritoneum was identified and entered. Peritoneal incision was extended longitudinally. The utero-vesical peritoneal reflection was incised transversely and the bladder flap was bluntly freed from the lower uterine segment. A low transverse uterine incision(Kerr hysterotomy) was made. Delivered from OA presentation was a  female with Apgar scores of 8 at one minute and 9 at five minutes. Bulb suctioning gently performed. Neonatal team in  attendance.After the umbilical cord was clamped and cut cord blood was obtained for evaluation. The placenta was removed intact and appeared normal. The uterus was curetted with a dry lap pack. Good hemostasis was noted.The uterine outline, tubes and ovaries appeared normal. The uterine incision was closed with running locked sutures of 0 Monocryl x 2 layers. Left cervical extension incorporated into 2 layer closure.Hemostasis was observed. Lavage was carried out until clear.The parietal peritoneum was closed with a running 2-0 Monocryl suture. The fascia was then reapproximated with running sutures of 0 Monocryl. The skin was reapproximated with 3-0 monocryl after [place] closure with 2-0 plain.  Instrument, sponge, and needle counts were correct prior the abdominal closure and at the conclusion of the case.   Findings: FTLF. Left cervical extension  Estimated Blood Loss:  800         Drains: foley                 Specimens: placenta                 Complications:  None; patient tolerated the procedure well.         Disposition: PACU - hemodynamically stable.         Condition: stable  Attending Attestation: I performed the procedure.

## 2016-07-08 LAB — CBC
HCT: 31.5 % — ABNORMAL LOW (ref 36.0–46.0)
Hemoglobin: 10.6 g/dL — ABNORMAL LOW (ref 12.0–15.0)
MCH: 33.4 pg (ref 26.0–34.0)
MCHC: 33.7 g/dL (ref 30.0–36.0)
MCV: 99.4 fL (ref 78.0–100.0)
Platelets: 150 10*3/uL (ref 150–400)
RBC: 3.17 MIL/uL — ABNORMAL LOW (ref 3.87–5.11)
RDW: 14.5 % (ref 11.5–15.5)
WBC: 13.2 10*3/uL — ABNORMAL HIGH (ref 4.0–10.5)

## 2016-07-08 LAB — BIRTH TISSUE RECOVERY COLLECTION (PLACENTA DONATION)

## 2016-07-08 LAB — RPR: RPR Ser Ql: NONREACTIVE

## 2016-07-08 MED ORDER — POLYSACCHARIDE IRON COMPLEX 150 MG PO CAPS
150.0000 mg | ORAL_CAPSULE | Freq: Every day | ORAL | Status: DC
  Administered 2016-07-09 – 2016-07-10 (×2): 150 mg via ORAL
  Filled 2016-07-08 (×2): qty 1

## 2016-07-08 NOTE — Lactation Note (Signed)
This note was copied from a baby's chart. Lactation Consultation Note  Patient Name: Megan Bowman [id number]'QToday's Date: 07/08/2016 Reason for consult: Follow-up assessment;Infant weight loss;Other (Comment) (4% weight loss, Challenging tissue for latching )  Baby is 26 hours old, Bilirubin increasing, last reading at 0532 - 7.6  Feeding - baby latched with #24 NS , and formula instilled in the top of the NS.  [facility]. Was able to wake baby up to assess a short latch  To assess the sizing of the Nipple Shield. The #20 NS is to small and too snug,  And the after pre-pumping the #24 NS fit well . Baby awake, LC instilled formula in the top and the baby fed  For about 5 mins, and sucked out the 3ml of formula that was instilled in the top of the NS for appetizer and she fell asleep. LC feels the NS is a borderline fit due to edema( better after pre-pumping)  Baby did accommodate the base of the nipple with FISH lips and stayed flanged, per mom more comfortable  Although nipples still feel tender.  LC stressed the importance of wearing the shells between feedings, to enhance the reverse pressure.  [facility] - reviewed with the [facility]hris Bow[facility] caring for the dyad.   LC Plan -  Shells between feedings except when sleeping Prior to feeding - 1st breast - breast massage, hand express, pre- pump 8-10 strokes to make the nipple more elastic  Apply #24 Nipple Shield - instill EBM or formula in the top as appetizer and latch with firm support.  Feed 15 -20 mins, and then supplement afterwards finger feeding for now. ( if not working switch supplementing to bottle )  Feed every 3 hours and with feeding cues.  Post pump after feedings ( per mom  Has already been instructed - #24 Flange is comfortable)       Maternal Data Has patient been taught Hand Expression?: Yes  Feeding - baby latched with #24 NS , and formula instilled in the top of the NS.    LATCH Score/Interventions Latch: Grasps breast easily, tongue down, lips flanged, rhythmical sucking. (pre - pumped with Handpump , and LC applied the #24 NS ) Intervention(s): Skin to skin;Teach feeding cues;Waking techniques Intervention(s): Adjust position;Assist with latch;Breast massage;Breast compression  Audible Swallowing: Spontaneous and intermittent (3 ml of formula / appetizer )  Type of Nipple: Flat (semi compressible areolas, improved some with pre -pumping )  Comfort (Breast/Nipple): Soft / non-tender  Problem noted: Mild/Moderate discomfort;Cracked, bleeding, blisters, bruises Interventions  (Cracked/bleeding/bruising/blister): Expressed breast milk to nipple  Hold (Positioning): Full assist, staff holds infant at breast Intervention(s): Breastfeeding basics reviewed;Support Pillows;Position options;Skin to skin  LATCH Score: 7  Lactation Tools Discussed/Used Tools: Nipple Shields;Pump;Shells (Encouraged mom to wear the Breast shells , and psot pump after feedings / save milk for next feedings ) Nipple shield size: 20;24;Other (comment) (resized for LC and the #20 NS was too small , used the #24 NS - see LC note ) Shell Type: Inverted Breast pump type: Manual (pre - pumped with HP 8-10 strokes -application of NS was improved/ still borderline fit due to edema ) Pump Review: Setup, frequency, and cleaning   Consult Status Consult Status: Follow-up Date: 07/09/16 Follow-up type: In-patient    Megan SprangMargaret Ann Byanka Bowman 07/08/2016, 2:22 PM

## 2016-07-08 NOTE — Progress Notes (Signed)
Subjective: Postpartum Day 1, primary emerg Cesarean Delivery for arrest of dilatation. GIRL, 6'10" , 6/28 11.32 am Patient reports nausea, vomiting, incisional pain and tolerating PO.    Objective: Vital signs in last 24 hours: Temp:  [97.6 F (36.4 C)-98.9 F (37.2 C)] 98.1 F (36.7 C) (06/29 0745) Pulse Rate:  [78-134] 78 (06/29 0745) Resp:  [17-20] 18 (06/29 0745) BP: (110-145)/(72-88) 110/72 (06/29 0745) SpO2:  [90 %-98 %] 98 % (06/29 0745)  Physical Exam:  General: alert and cooperative Lochia: inappropriate Uterine Fundus: firm. BS normal, soft abdomen  Incision: healing well DVT Evaluation: No evidence of DVT seen on physical exam.   Recent Labs  07/07/16 1046 07/08/16 0529  HGB 12.9 10.6*  HCT 37.9 31.5*   O(+) Rub Imm   Assessment/Plan: Status post Cesarean section POD#1 . Doing well postoperatively.  Continue current care. Blood loss anemia - Iron  Post-op care reviewed.   Ryah Cribb R 07/08/2016, 9:50 AM

## 2016-07-09 ENCOUNTER — Encounter (HOSPITAL_COMMUNITY): Payer: Self-pay

## 2016-07-09 DIAGNOSIS — O9902 Anemia complicating childbirth: Secondary | ICD-10-CM | POA: Diagnosis not present

## 2016-07-09 MED ORDER — MAGNESIUM OXIDE 400 (241.3 MG) MG PO TABS
400.0000 mg | ORAL_TABLET | Freq: Every day | ORAL | Status: DC
  Administered 2016-07-09 – 2016-07-10 (×2): 400 mg via ORAL
  Filled 2016-07-09 (×2): qty 1

## 2016-07-09 NOTE — Progress Notes (Signed)
Subjective: POD# 2 Information for the patient's newborn:  Megan Bowman, [name] [[id number]][[id number]]  female  Baby name: Megan Bowman  Reports feeling sore but well. Feeding: breast, sore nipples, flat, difficulty latching Patient reports tolerating PO.  Breast symptoms: + colostrum Pain controlled with PO meds Denies HA/SOB/C/P/N/V/dizziness. Flatus present. She reports vaginal bleeding as normal, without clots.  She is ambulating, urinating without difficulty.     Objective:   VS:    Vitals:   07/08/16 1240 07/08/16 1857 07/09/16 0541 07/09/16 0800  BP: 114/65 126/79 127/89   Pulse: 72 82 91   Resp: 18 18 18    Temp: 98.1 F (36.7 C) 97.5 F (36.4 C) 98.2 F (36.8 C)   TempSrc: Oral Oral Oral   SpO2: 97%     Weight:    92.5 kg (204 lb)  Height:    4\' 11"  (1.499 m)     Intake/Output Summary (Last 24 hours) at 07/09/16 1019 Last data filed at 07/08/16 1800  Gross per 24 hour  Intake                0 ml  Output              950 ml  Net             -950 ml        Recent Labs  07/07/16 1046 07/08/16 0529  WBC 18.4* 13.2*  HGB 12.9 10.6*  HCT 37.9 31.5*  PLT 146* 150     Blood type: --/--/O POS (06/28 1059)  Rubella: Immune (11/16 0000)     Physical Exam:  General: alert, cooperative and no distress Abdomen: soft, nontender, normal bowel sounds Incision: dry, intact and old serous  drainage present Uterine Fundus: firm, below umbilicus, nontender Lochia: minimal Ext: edema +2 pedal, and no redness or tenderness in the calves or thighs      Assessment/Plan: 31 y.o.   POD# 2. G1P1000                  Principal Problem:   Postpartum care following cesarean delivery (6/28) Active Problems:   Cesarean delivery delivered - indication: arrest of dilation   Maternal anemia, with delivery -start oral Fe and Mag ox  Doing well, stable.               Advance diet as tolerated Encourage rest when baby rests Breastfeeding support Encourage to ambulate Routine post-op  care  Megan Bowman, CNM, MSN 07/09/2016, 10:19 AM

## 2016-07-09 NOTE — Lactation Note (Signed)
This note was copied from a baby's chart. Lactation Consultation Note  Patient Name: Megan Bowman WUJWJ'XToday's Date: 07/09/2016 Reason for consult: Follow-up assessment;Difficult latch   Follow up with mom of 51 hour old infant. Infant having NB pictures made.  Infant with 12 formula feeds of 7-12 ml, 3 voids and 2 stools in last 24 hours. Mom latching infant to breast some and priming NS with formula. Parents are finger feeding infant and want to continue this at this time. Reviewed formula supplementation guidelines and enc parents to increase volumes based on day of age.   Mom reports she is not pumping regularly as nothing was coming out, although she noted some dripping the last time she pumped when removing flanges from breast. Enc mom to pump at least 8 x a day to protect milk supply. Mom asked if she should feed infant any EBM, enc mom to feed all EBM obtained to infant prior to offering formula. Mom asking if infant should go home on Alimentum, discussed that is a good bridge formula until milk comes in but generally not considered a long term formula if infant can tolerate formula with lactose. Formula Preparation handout given in answer to family's questions about storing formula at home.   Mom declined need for LC feeding assistance at this time. Family without further questions/concerns at this time.     Maternal Data Formula Feeding for Exclusion: No Has patient been taught Hand Expression?: Yes Does the patient have breastfeeding experience prior to this delivery?: No  Feeding    LATCH Score/Interventions                      Lactation Tools Discussed/Used Pump Review: Setup, frequency, and cleaning Initiated by:: Reviewed and encouraged 8-12 x a day   Consult Status Consult Status: Follow-up Date: 07/10/16 Follow-up type: In-patient    Silas FloodSharon S Kati Riggenbach 07/09/2016, 3:42 PM

## 2016-07-10 MED ORDER — SIMETHICONE 80 MG PO CHEW: 80.0000 mg | CHEWABLE_TABLET | Freq: Three times a day (TID) | ORAL | 0 refills | Status: DC

## 2016-07-10 MED ORDER — OXYCODONE-ACETAMINOPHEN 5-325 MG PO TABS: 1.0000 | ORAL_TABLET | ORAL | 0 refills | Status: DC | PRN

## 2016-07-10 MED ORDER — ACETAMINOPHEN 325 MG PO TABS: 650.0000 mg | ORAL_TABLET | ORAL | Status: DC | PRN

## 2016-07-10 MED ORDER — COCONUT OIL OIL: 1.0000 "application " | TOPICAL_OIL | 0 refills | Status: DC | PRN

## 2016-07-10 MED ORDER — SENNOSIDES-DOCUSATE SODIUM 8.6-50 MG PO TABS: 2.0000 | ORAL_TABLET | ORAL | Status: DC

## 2016-07-10 MED ORDER — POLYSACCHARIDE IRON COMPLEX 150 MG PO CAPS: 150.0000 mg | ORAL_CAPSULE | Freq: Every day | ORAL | Status: DC

## 2016-07-10 MED ORDER — MAGNESIUM OXIDE 400 (241.3 MG) MG PO TABS: 400.0000 mg | ORAL_TABLET | Freq: Every day | ORAL | Status: DC

## 2016-07-10 MED ORDER — IBUPROFEN 600 MG PO TABS: 600.0000 mg | ORAL_TABLET | Freq: Four times a day (QID) | ORAL | 0 refills | Status: DC

## 2016-07-10 NOTE — Discharge Summary (Signed)
OB Discharge Summary     Patient Name: Megan Bowman DOB: 12/28/1985 MRN: [id number]  Date of admission: 07/06/2016 Delivering MD: Olivia MackieAAVON, RICHARD   Date of discharge: 07/10/2016  Admitting diagnosis: 39WKS WATER BROKE Intrauterine pregnancy: 4567w5d     Secondary diagnosis:  Principal Problem:   Postpartum care following cesarean delivery (6/28) Active Problems:   Pregnancy   Cesarean delivery delivered   Maternal anemia, with delivery      Discharge diagnosis: Term Pregnancy Delivered and Anemia                                                                                                Complications: None  Hospital course:  Onset of Labor With Unplanned C/S  31 y.o. yo G1P1000 at 7367w5d was admitted in Active Labor on 07/06/2016. Patient had a labor course significant for protracted active labor. Membrane Rupture Time/Date: 9:00 PM ,07/06/2016   The patient went for cesarean section due to Arrest of Dilation, and delivered a Viable infant,07/07/2016  Details of operation can be found in separate operative note. Patient had an uncomplicated postpartum course.  She is ambulating,tolerating a regular diet, passing flatus, and urinating well.  Patient is discharged home in stable condition 07/10/16.  Physical exam  Vitals:   07/09/16 0541 07/09/16 0800 07/09/16 1755 07/10/16 0515  BP: 127/89  127/88 128/88  Pulse: 91  94 85  Resp: 18  12 16   Temp: 98.2 F (36.8 C)  97.7 F (36.5 C) 98.3 F (36.8 C)  TempSrc: Oral  Oral Oral  SpO2:   99% 100%  Weight:  92.5 kg (204 lb)    Height:  4\' 11"  (1.499 m)     General: alert, cooperative and no distress Lochia: appropriate Uterine Fundus: firm Incision: Healing well with no significant drainage, Dressing is clean, dry, and intact DVT Evaluation: No cords or calf tenderness. Calf/Ankle edema is present Labs: Lab Results  Component Value Date   WBC 13.2 (H) 07/08/2016   HGB 10.6 (L) 07/08/2016   HCT 31.5 (L) 07/08/2016   MCV  99.4 07/08/2016   PLT 150 07/08/2016   No flowsheet data found.  Discharge instruction: per After Visit Summary and "Baby and Me Booklet".  After visit meds:  Allergies as of 07/10/2016      Reactions   Amoxicillin Diarrhea, Nausea And Vomiting   Has patient had a PCN reaction causing immediate rash, facial/tongue/throat swelling, SOB or lightheadedness with hypotension: Unknown Has patient had a PCN reaction causing severe rash involving mucus membranes or skin necrosis: Unknown Has patient had a PCN reaction that required hospitalization: Unknown Has patient had a PCN reaction occurring within the last 10 years: Unknown If all of the above answers are "NO", then may proceed with Cephalosporin use.      Medication List    STOP taking these medications   valACYclovir 500 MG tablet Commonly known as:  VALTREX     TAKE these medications   acetaminophen 325 MG tablet Commonly known as:  TYLENOL Take 2 tablets (650 mg total) by mouth every 4 (four) hours as needed (for  pain scale < 4).   coconut oil Oil Apply 1 application topically as needed.   ibuprofen 600 MG tablet Commonly known as:  ADVIL,MOTRIN Take 1 tablet (600 mg total) by mouth every 6 (six) hours.   iron polysaccharides 150 MG capsule Commonly known as:  NIFEREX Take 1 capsule (150 mg total) by mouth daily.   loratadine 10 MG tablet Commonly known as:  CLARITIN Take 10 mg by mouth daily.   magnesium oxide 400 (241.3 Mg) MG tablet Commonly known as:  MAG-OX Take 1 tablet (400 mg total) by mouth daily.   oxyCODONE-acetaminophen 5-325 MG tablet Commonly known as:  PERCOCET/ROXICET Take 1 tablet by mouth every 4 (four) hours as needed (pain scale 4-7).   prenatal multivitamin Tabs tablet Take 1 tablet by mouth daily at 12 noon.   senna-docusate 8.6-50 MG tablet Commonly known as:  Senokot-S Take 2 tablets by mouth daily. Start taking on:  07/11/2016   simethicone 80 MG chewable tablet Commonly known as:   MYLICON Chew 1 tablet (80 mg total) by mouth 3 (three) times daily after meals.       Diet: routine diet  Activity: Advance as tolerated. Pelvic rest for 6 weeks.   Outpatient follow up:6 weeks at [facility]  Postpartum contraception: Not Discussed  Newborn Data: Live born female Megan Bowman Weight: 6 lb 10.2 oz (3010 g) APGAR: 9, 9  Baby Feeding: Bottle and Breast Disposition:home with mother   07/10/2016 Neta Mends, CNM

## 2016-07-10 NOTE — Lactation Note (Signed)
This note was copied from a baby's chart. Lactation Consultation Note  Patient Name: Megan Argentina PonderKimberly Rothgeb WUJWJ'XToday's Date: 07/10/2016  Mom states she puts baby to breast using a 24 mm nipple shield, post pumps and syringe feeds expressed milk/formula.  Mom has a medela DEBP at home.  Instructed to pump 8-12 times in 24 hours to establish and maintain a good milk supply.  Recommended parents use a wide based bottle for feedings at home to get enough volume in with ease and train baby to open mouth wider.  Parents agree.  Recommended an outpatient appointment after milk is in and mom will call tomorrow for a time.   Maternal Data    Feeding Length of feed: 10 min  LATCH Score/Interventions                      Lactation Tools Discussed/Used     Consult Status      Huston FoleyMOULDEN, Jamerion Cabello S 07/10/2016, 10:05 AM

## 2016-07-15 ENCOUNTER — Ambulatory Visit: Payer: Self-pay

## 2016-07-15 NOTE — Lactation Note (Signed)
This note was copied from a baby's chart. Lactation Consult  Mother's reason for visit:  Difficulty latching,  Feeding assessment Visit Type:  Outpatient Appointment Notes:  Baby was jaundiced and was encouraged by Pediatrician to supplement.  Mother has been mostly formula feeding.  She has been breastfeeding 2-3 times per day and supplementing with breastmilk and formula. Consult:  Initial Lactation Consultant:  Hardie PulleyBerkelhammer, Darlena Koval Boschen  ________________________________________________________________________ Joan FloresBaby's Name:  Megan PonderKimberly Bowman Date of Birth:  10/18/1985 Pediatrician:  Avis Epleyees Gender:  female Gestational Age: <None> (At Birth) Birth Weight:    Weight at Discharge:  Weight: 3264 oz                      Date of Discharge:  07/10/2016    Filed Weights   07/09/16 0800  Weight: 3264 oz  [facility]:  6 lb 10 oz     Location:Pediatrician's office Weight today:  7 lb 2.4 oz. ________________________________________________________________________  Mother's Name: Megan Bowman Type of delivery:  C-Section, Low Transverse Breastfeeding Experience:  Primip Maternal Medications:  Fenugreek ( 1 capsule per day) PNV, Claritin 24 hours ( suggest trying natural sinus methods) Valtrex, Magnesium, Iron, stool softeners  ________________________________________________________________________  Breastfeeding History (Post Discharge)  Frequency of breastfeeding:  2-3 times per day Duration of feeding:  10 min  Supplementation  Formula:  Volume 2-2.5 oz Frequency:  8 times per day Total volume per day:  16-20 oz per day       Brand: Similac  Breastmilk:  Volume 30- 100 ml Frequency:  2-3 times per day Total volume per day:  60-300 ml  Method:  Bottle,   Pumping  Type of pump:  Medela pump in style Frequency:  2-3 times per day Volume:  30-100 ml  Infant Intake and Output Assessment  Voids:  8-10 in 24 hrs.  Color:  Clear  yellow Stools:  6-8 in 24 hrs.  Color:  Brown and Yellow  ________________________________________________________________________  Maternal Breast Assessment  Breast:  Full Nipple:  Erect (short shaft) Pain level:  0 Pain interventions:  Expressed breast milk  _______________________________________________________________________ Feeding Assessment/Evaluation  Initial feeding assessment:  Infant's oral assessment:  WNL  Positioning:  Cross cradle Left breast  LATCH documentation:  Latch:  1 = Repeated attempts needed to sustain latch, nipple held in mouth throughout feeding, stimulation needed to elicit sucking reflex.  Audible swallowing:  1 = A few with stimulation  Type of nipple:  2 = Everted at rest and after stimulation  Comfort (Breast/Nipple):  2 = Soft / non-tender  Hold (Positioning):  1 = Assistance needed to correctly position infant at breast and maintain latch  LATCH score:  7  Attached assessment:  Deep  Lips flanged:  Yes.    Lips untucked:  Yes.    Suck assessment:  Displays both  Tools:  Nipple shield 24 mm Instructed on use and cleaning of tool:  Yes.    Pre-feed weight:  3244 g  (7 lb. 2.4 oz.) Post-feed weight:  3282 g (7 lb. 3.8 oz.) Amount transferred:  38 ml Amount supplemented:  0 ml  Additional Feeding Assessment -   Infant's oral assessment:  WNL  Positioning:  Cross cradle Left breast  LATCH documentation:  Latch:  2 = Grasps breast easily, tongue down, lips flanged, rhythmical sucking.  Audible swallowing:  1 = A few with stimulation  Type of nipple:  2 = Everted at rest and after  stimulation  Comfort (Breast/Nipple):  2 = Soft / non-tender  Hold (Positioning):  1 = Assistance needed to correctly position infant at breast and maintain latch  LATCH score:  7  Attached assessment:  Deep  Lips flanged:  Yes.    Lips untucked:  No.  Suck assessment:  Displays both  Tools:  Nipple shield 24 mm Instructed on use and cleaning  of tool:  Yes.    Pre-feed weight:  3282 g  (7 lb. 3.8 oz.) Post-feed weight:  3290 g (7 lb. 4 oz.) Amount transferred:  8 ml Amount supplemented:  0 ml  Total amount transferred:  46 ml Total supplement given:  0 ml  Due to jaundice mother states she has been mostly formula feeding.  She is latching using a #24NS.  Attempted latching without NS but baby is tongue thrusting.  Reviewed suck training.  Applied #24NS and baby latched and with both breasts sustained latch for approx 40 min.  She transferred approx 46 ml. Plan: Hand express before latching to help evert nipple and prep flow of breastmilk. Apply #24NS and latch baby. Mom encouraged to feed baby 8-12 times/24 hours and with feeding cues.  Half way through feeding, attempt latching without nipple shield. Post pump minimum of 2-3 times per day if using NS and give volume pumped back to baby. Increase fenugreek from 1 capsule to 9 capsules until mother feels increase in milk supply. Watch "hands on pumping" video. Continue supplement with formula as baby desires but goal is more time at the breast to establish milk supply.

## 2016-08-23 DIAGNOSIS — Z1151 Encounter for screening for human papillomavirus (HPV): Secondary | ICD-10-CM | POA: Diagnosis not present

## 2016-10-12 DIAGNOSIS — Z23 Encounter for immunization: Secondary | ICD-10-CM | POA: Diagnosis not present

## 2016-12-07 DIAGNOSIS — H1030 Unspecified acute conjunctivitis, unspecified eye: Secondary | ICD-10-CM | POA: Diagnosis not present

## 2017-01-25 DIAGNOSIS — J029 Acute pharyngitis, unspecified: Secondary | ICD-10-CM | POA: Diagnosis not present

## 2017-01-25 DIAGNOSIS — R509 Fever, unspecified: Secondary | ICD-10-CM | POA: Diagnosis not present

## 2017-01-25 DIAGNOSIS — B349 Viral infection, unspecified: Secondary | ICD-10-CM | POA: Diagnosis not present

## 2017-04-03 DIAGNOSIS — D2271 Melanocytic nevi of right lower limb, including hip: Secondary | ICD-10-CM | POA: Diagnosis not present

## 2017-04-03 DIAGNOSIS — L814 Other melanin hyperpigmentation: Secondary | ICD-10-CM | POA: Diagnosis not present

## 2017-04-03 DIAGNOSIS — D225 Melanocytic nevi of trunk: Secondary | ICD-10-CM | POA: Diagnosis not present

## 2017-04-03 DIAGNOSIS — D1800 Hemangioma unspecified site: Secondary | ICD-10-CM | POA: Diagnosis not present

## 2017-09-22 DIAGNOSIS — Z23 Encounter for immunization: Secondary | ICD-10-CM | POA: Diagnosis not present

## 2017-10-03 DIAGNOSIS — Z6833 Body mass index (BMI) 33.0-33.9, adult: Secondary | ICD-10-CM | POA: Diagnosis not present

## 2017-10-03 DIAGNOSIS — Z01419 Encounter for gynecological examination (general) (routine) without abnormal findings: Secondary | ICD-10-CM | POA: Diagnosis not present

## 2017-12-29 DIAGNOSIS — J0101 Acute recurrent maxillary sinusitis: Secondary | ICD-10-CM | POA: Diagnosis not present

## 2018-01-05 DIAGNOSIS — Z3201 Encounter for pregnancy test, result positive: Secondary | ICD-10-CM | POA: Diagnosis not present

## 2018-01-23 DIAGNOSIS — Z3201 Encounter for pregnancy test, result positive: Secondary | ICD-10-CM | POA: Diagnosis not present

## 2018-01-30 DIAGNOSIS — Z3481 Encounter for supervision of other normal pregnancy, first trimester: Secondary | ICD-10-CM | POA: Diagnosis not present

## 2018-01-30 DIAGNOSIS — Z3689 Encounter for other specified antenatal screening: Secondary | ICD-10-CM | POA: Diagnosis not present

## 2018-01-30 LAB — OB RESULTS CONSOLE HEPATITIS B SURFACE ANTIGEN: Hepatitis B Surface Ag: NEGATIVE

## 2018-01-30 LAB — OB RESULTS CONSOLE RPR: RPR: NONREACTIVE

## 2018-01-30 LAB — OB RESULTS CONSOLE RUBELLA ANTIBODY, IGM: Rubella: IMMUNE

## 2018-01-30 LAB — OB RESULTS CONSOLE GC/CHLAMYDIA
Chlamydia: NEGATIVE
Gonorrhea: NEGATIVE

## 2018-01-30 LAB — OB RESULTS CONSOLE ABO/RH: RH Type: POSITIVE

## 2018-01-30 LAB — OB RESULTS CONSOLE ANTIBODY SCREEN: Antibody Screen: NEGATIVE

## 2018-01-30 LAB — OB RESULTS CONSOLE HIV ANTIBODY (ROUTINE TESTING): HIV: NONREACTIVE

## 2018-02-09 DIAGNOSIS — Z3481 Encounter for supervision of other normal pregnancy, first trimester: Secondary | ICD-10-CM | POA: Diagnosis not present

## 2018-02-09 DIAGNOSIS — Z113 Encounter for screening for infections with a predominantly sexual mode of transmission: Secondary | ICD-10-CM | POA: Diagnosis not present

## 2018-02-09 DIAGNOSIS — Z3682 Encounter for antenatal screening for nuchal translucency: Secondary | ICD-10-CM | POA: Diagnosis not present

## 2018-02-10 ENCOUNTER — Other Ambulatory Visit (HOSPITAL_COMMUNITY): Payer: Self-pay | Admitting: Obstetrics and Gynecology

## 2018-02-23 DIAGNOSIS — Z3682 Encounter for antenatal screening for nuchal translucency: Secondary | ICD-10-CM | POA: Diagnosis not present

## 2018-02-23 DIAGNOSIS — Z3481 Encounter for supervision of other normal pregnancy, first trimester: Secondary | ICD-10-CM | POA: Diagnosis not present

## 2018-03-23 DIAGNOSIS — Z3482 Encounter for supervision of other normal pregnancy, second trimester: Secondary | ICD-10-CM | POA: Diagnosis not present

## 2018-03-23 DIAGNOSIS — Z361 Encounter for antenatal screening for raised alphafetoprotein level: Secondary | ICD-10-CM | POA: Diagnosis not present

## 2018-04-10 DIAGNOSIS — Z363 Encounter for antenatal screening for malformations: Secondary | ICD-10-CM | POA: Diagnosis not present

## 2018-04-10 DIAGNOSIS — Z3482 Encounter for supervision of other normal pregnancy, second trimester: Secondary | ICD-10-CM | POA: Diagnosis not present

## 2018-04-24 ENCOUNTER — Encounter (HOSPITAL_COMMUNITY): Payer: Self-pay | Admitting: Obstetrics and Gynecology

## 2018-04-24 DIAGNOSIS — Z3482 Encounter for supervision of other normal pregnancy, second trimester: Secondary | ICD-10-CM | POA: Diagnosis not present

## 2018-04-24 DIAGNOSIS — Z362 Encounter for other antenatal screening follow-up: Secondary | ICD-10-CM | POA: Diagnosis not present

## 2018-04-26 ENCOUNTER — Other Ambulatory Visit (HOSPITAL_COMMUNITY): Payer: Self-pay | Admitting: Obstetrics and Gynecology

## 2018-04-26 DIAGNOSIS — O358XX0 Maternal care for other (suspected) fetal abnormality and damage, not applicable or unspecified: Secondary | ICD-10-CM

## 2018-04-26 DIAGNOSIS — Z3689 Encounter for other specified antenatal screening: Secondary | ICD-10-CM

## 2018-04-26 DIAGNOSIS — Z3A22 22 weeks gestation of pregnancy: Secondary | ICD-10-CM

## 2018-04-26 DIAGNOSIS — IMO0002 Reserved for concepts with insufficient information to code with codable children: Secondary | ICD-10-CM

## 2018-04-30 ENCOUNTER — Ambulatory Visit (HOSPITAL_COMMUNITY)
Admission: RE | Admit: 2018-04-30 | Discharge: 2018-04-30 | Disposition: A | Discharge status: Home / Self Care | Payer: BLUE CROSS/BLUE SHIELD | Source: Ambulatory Visit | Attending: Obstetrics and Gynecology | Admitting: Obstetrics and Gynecology

## 2018-04-30 ENCOUNTER — Other Ambulatory Visit: Payer: Self-pay

## 2018-04-30 ENCOUNTER — Ambulatory Visit (HOSPITAL_COMMUNITY): Payer: BLUE CROSS/BLUE SHIELD | Admitting: *Deleted

## 2018-04-30 ENCOUNTER — Encounter (HOSPITAL_COMMUNITY): Payer: Self-pay

## 2018-04-30 VITALS — Temp 99.0°F | Ht 59.5 in

## 2018-04-30 DIAGNOSIS — Z3A22 22 weeks gestation of pregnancy: Secondary | ICD-10-CM | POA: Insufficient documentation

## 2018-04-30 DIAGNOSIS — Z3689 Encounter for other specified antenatal screening: Secondary | ICD-10-CM

## 2018-04-30 DIAGNOSIS — O359XX0 Maternal care for (suspected) fetal abnormality and damage, unspecified, not applicable or unspecified: Secondary | ICD-10-CM

## 2018-04-30 DIAGNOSIS — O358XX0 Maternal care for other (suspected) fetal abnormality and damage, not applicable or unspecified: Secondary | ICD-10-CM | POA: Diagnosis not present

## 2018-04-30 DIAGNOSIS — IMO0002 Reserved for concepts with insufficient information to code with codable children: Secondary | ICD-10-CM

## 2018-04-30 DIAGNOSIS — Z363 Encounter for antenatal screening for malformations: Secondary | ICD-10-CM

## 2018-04-30 DIAGNOSIS — O99212 Obesity complicating pregnancy, second trimester: Secondary | ICD-10-CM

## 2018-04-30 DIAGNOSIS — O34219 Maternal care for unspecified type scar from previous cesarean delivery: Secondary | ICD-10-CM

## 2018-05-16 DIAGNOSIS — Z3482 Encounter for supervision of other normal pregnancy, second trimester: Secondary | ICD-10-CM | POA: Diagnosis not present

## 2018-06-13 DIAGNOSIS — Z3483 Encounter for supervision of other normal pregnancy, third trimester: Secondary | ICD-10-CM | POA: Diagnosis not present

## 2018-06-13 DIAGNOSIS — Z3689 Encounter for other specified antenatal screening: Secondary | ICD-10-CM | POA: Diagnosis not present

## 2018-06-13 DIAGNOSIS — Z23 Encounter for immunization: Secondary | ICD-10-CM | POA: Diagnosis not present

## 2018-06-28 DIAGNOSIS — Z3483 Encounter for supervision of other normal pregnancy, third trimester: Secondary | ICD-10-CM | POA: Diagnosis not present

## 2018-07-10 DIAGNOSIS — Z3483 Encounter for supervision of other normal pregnancy, third trimester: Secondary | ICD-10-CM | POA: Diagnosis not present

## 2018-07-24 DIAGNOSIS — Z3483 Encounter for supervision of other normal pregnancy, third trimester: Secondary | ICD-10-CM | POA: Diagnosis not present

## 2018-07-31 DIAGNOSIS — O3663X0 Maternal care for excessive fetal growth, third trimester, not applicable or unspecified: Secondary | ICD-10-CM | POA: Diagnosis not present

## 2018-07-31 DIAGNOSIS — Z3A35 35 weeks gestation of pregnancy: Secondary | ICD-10-CM | POA: Diagnosis not present

## 2018-07-31 DIAGNOSIS — Z3685 Encounter for antenatal screening for Streptococcus B: Secondary | ICD-10-CM | POA: Diagnosis not present

## 2018-08-03 ENCOUNTER — Other Ambulatory Visit: Payer: Self-pay | Admitting: Obstetrics and Gynecology

## 2018-08-14 DIAGNOSIS — Z3A37 37 weeks gestation of pregnancy: Secondary | ICD-10-CM | POA: Diagnosis not present

## 2018-08-14 DIAGNOSIS — O3663X0 Maternal care for excessive fetal growth, third trimester, not applicable or unspecified: Secondary | ICD-10-CM | POA: Diagnosis not present

## 2018-08-17 ENCOUNTER — Telehealth (HOSPITAL_COMMUNITY): Payer: Self-pay | Admitting: *Deleted

## 2018-08-17 NOTE — Telephone Encounter (Signed)
Preadmission screen  

## 2018-08-17 NOTE — Patient Instructions (Signed)
Kamali Sakata  08/17/2018   Your procedure is scheduled on:  08/29/2018  Arrive at [place] at Entrance C on [address] at [facility]  and [address]. You are invited to use the FREE valet parking or use the Visitor's parking deck.  Pick up the phone at the desk and dial [phone].  Call this number if you have problems the morning of surgery: [phone]  Remember:   Do not eat food:(After Midnight) Desps de medianoche.  Do not drink clear liquids: (After Midnight) Desps de medianoche.  Take these medicines the morning of surgery with A SIP OF WATER:  valtrex   Do not wear jewelry, make-up or nail polish.  Do not wear lotions, powders, or perfumes. Do not wear deodorant.  Do not shave 48 hours prior to surgery.  Do not bring valuables to the hospital.  [facility] is not   responsible for any belongings or valuables brought to the hospital.  Contacts, dentures or bridgework may not be worn into surgery.  Leave suitcase in the car. After surgery it may be brought to your room.  For patients admitted to the hospital, checkout time is 11:00 AM the day of              discharge.      Please read over the following fact sheets that you were given:     Preparing for Surgery

## 2018-08-20 ENCOUNTER — Encounter (HOSPITAL_COMMUNITY): Payer: Self-pay

## 2018-08-21 DIAGNOSIS — Z3483 Encounter for supervision of other normal pregnancy, third trimester: Secondary | ICD-10-CM | POA: Diagnosis not present

## 2018-08-27 ENCOUNTER — Other Ambulatory Visit: Payer: Self-pay

## 2018-08-27 ENCOUNTER — Other Ambulatory Visit (HOSPITAL_COMMUNITY)
Admission: RE | Admit: 2018-08-27 | Discharge: 2018-08-27 | Disposition: A | Discharge status: Home / Self Care | Payer: BC Managed Care – PPO | Source: Ambulatory Visit | Attending: Obstetrics and Gynecology | Admitting: Obstetrics and Gynecology

## 2018-08-27 DIAGNOSIS — Z01812 Encounter for preprocedural laboratory examination: Secondary | ICD-10-CM | POA: Diagnosis not present

## 2018-08-27 DIAGNOSIS — Z20828 Contact with and (suspected) exposure to other viral communicable diseases: Secondary | ICD-10-CM | POA: Diagnosis not present

## 2018-08-27 LAB — CBC
HCT: 37.4 % (ref 36.0–46.0)
Hemoglobin: 12.7 g/dL (ref 12.0–15.0)
MCH: 34.4 pg — ABNORMAL HIGH (ref 26.0–34.0)
MCHC: 34 g/dL (ref 30.0–36.0)
MCV: 101.4 fL — ABNORMAL HIGH (ref 80.0–100.0)
Platelets: 150 10*3/uL (ref 150–400)
RBC: 3.69 MIL/uL — ABNORMAL LOW (ref 3.87–5.11)
RDW: 14.1 % (ref 11.5–15.5)
WBC: 9.3 10*3/uL (ref 4.0–10.5)
nRBC: 0 % (ref 0.0–0.2)

## 2018-08-27 LAB — TYPE AND SCREEN
ABO/RH(D): O POS
Antibody Screen: NEGATIVE

## 2018-08-27 LAB — SARS CORONAVIRUS 2 (TAT 6-24 HRS): SARS Coronavirus 2: NEGATIVE

## 2018-08-27 LAB — ABO/RH: ABO/RH(D): O POS

## 2018-08-27 NOTE — MAU Note (Signed)
Covid swab collected.PT tolerated well. Pt asymptomatic. CBC,RPR, Type and Screen drawn by lab

## 2018-08-28 LAB — RPR: RPR Ser Ql: NONREACTIVE

## 2018-08-28 NOTE — H&P (Deleted)
  The note originally documented on this encounter has been moved the the encounter in which it belongs.  

## 2018-08-28 NOTE — H&P (Signed)
Megan Bowman is a 33 y.o. female presenting for rpt csection and TL OB History    Gravida  2   Para  1   Term  1   Preterm      AB      Living  1     SAB      TAB      Ectopic      Multiple  0   Live Births             Past Medical History:  Diagnosis Date  . Medical history non-contributory    Past Surgical History:  Procedure Laterality Date  . CESAREAN SECTION N/A 07/07/2016   Procedure: CESAREAN SECTION;  Surgeon: Brien Few, MD;  Location: [place];  Service: Obstetrics;  Laterality: N/A;  . HERNIA REPAIR     Family History: family history includes Cancer in her mother; Hypertension in her father. Social History:  reports that she has quit smoking. Her smoking use included cigarettes. She has quit using smokeless tobacco. She reports previous alcohol use. She reports that she does not use drugs.     Maternal Diabetes: No Genetic Screening: Normal Maternal Ultrasounds/Referrals: Normal Fetal Ultrasounds or other Referrals:  None Maternal Substance Abuse:  No Significant Maternal Medications:  None Significant Maternal Lab Results:  None Other Comments:  None  Review of Systems  Constitutional: Negative.   All other systems reviewed and are negative.  Maternal Medical History:  Contractions: Onset was less than 1 hour ago.   Frequency: rare.   Perceived severity is mild.    Fetal activity: Perceived fetal activity is normal.   Last perceived fetal movement was within the past hour.    Prenatal complications: Polyhydramnios.   Prenatal Complications - Diabetes: none.      Last menstrual period 11/27/2017, unknown if currently breastfeeding. Maternal Exam:  Uterine Assessment: Contraction strength is mild.  Contraction frequency is rare.   Abdomen: Patient reports no abdominal tenderness. Surgical scars: low transverse.   Fetal presentation: vertex  Introitus: Normal vulva. Normal vagina.  Ferning test: not done.   Nitrazine test: not done. Amniotic fluid character: not assessed.  Pelvis: questionable for delivery.   Cervix: Cervix evaluated by digital exam.     Physical Exam  Nursing note and vitals reviewed. Constitutional: She is oriented to person, place, and time. She appears well-developed and well-nourished.  HENT:  Head: Normocephalic and atraumatic.  Neck: Neck supple.  Cardiovascular: Normal rate and regular rhythm.  Respiratory: Effort normal and breath sounds normal.  GI: Soft. Bowel sounds are normal.  Genitourinary:    Vulva, vagina and uterus normal.   Musculoskeletal: Normal range of motion.  Neurological: She is alert and oriented to person, place, and time. She has normal reflexes.  Skin: Skin is warm and dry.  Psychiatric: She has a normal mood and affect.    Prenatal labs: ABO, Rh: --/--/O POS, O POS Performed at [facility], [place] [address] [place], [place] [address]  [id number]) Antibody: NEG (08/17 2694) Rubella: Immune (01/21 0000) RPR: Non Reactive (08/17 0856)  HBsAg: Negative (01/21 0000)  HIV: Non-reactive (01/21 0000)  GBS:     Assessment/Plan: Previous csection fort rpt and TL Consent done Risks of surgery noted.   Jaylyn Iyer J 08/28/2018, 9:09 PM

## 2018-08-29 ENCOUNTER — Inpatient Hospital Stay (HOSPITAL_COMMUNITY): Payer: BC Managed Care – PPO | Admitting: Anesthesiology

## 2018-08-29 ENCOUNTER — Encounter (HOSPITAL_COMMUNITY)
Admission: RE | Disposition: A | Discharge status: Home / Self Care | Payer: Self-pay | Source: Home / Self Care | Attending: Obstetrics and Gynecology

## 2018-08-29 ENCOUNTER — Inpatient Hospital Stay (HOSPITAL_COMMUNITY)
Admission: RE | Admit: 2018-08-29 | Discharge: 2018-08-31 | DRG: 785 | Disposition: A | Discharge status: Home / Self Care | Payer: BC Managed Care – PPO | Attending: Obstetrics and Gynecology | Admitting: Obstetrics and Gynecology

## 2018-08-29 ENCOUNTER — Other Ambulatory Visit: Payer: Self-pay

## 2018-08-29 ENCOUNTER — Encounter (HOSPITAL_COMMUNITY): Payer: Self-pay | Admitting: *Deleted

## 2018-08-29 DIAGNOSIS — Z302 Encounter for sterilization: Secondary | ICD-10-CM

## 2018-08-29 DIAGNOSIS — Z3A39 39 weeks gestation of pregnancy: Secondary | ICD-10-CM | POA: Diagnosis not present

## 2018-08-29 DIAGNOSIS — Z23 Encounter for immunization: Secondary | ICD-10-CM | POA: Diagnosis not present

## 2018-08-29 DIAGNOSIS — O34219 Maternal care for unspecified type scar from previous cesarean delivery: Secondary | ICD-10-CM | POA: Diagnosis present

## 2018-08-29 DIAGNOSIS — O99214 Obesity complicating childbirth: Secondary | ICD-10-CM | POA: Diagnosis present

## 2018-08-29 DIAGNOSIS — Z87891 Personal history of nicotine dependence: Secondary | ICD-10-CM | POA: Diagnosis not present

## 2018-08-29 DIAGNOSIS — O34211 Maternal care for low transverse scar from previous cesarean delivery: Secondary | ICD-10-CM | POA: Diagnosis not present

## 2018-08-29 DIAGNOSIS — O9902 Anemia complicating childbirth: Secondary | ICD-10-CM | POA: Diagnosis present

## 2018-08-29 DIAGNOSIS — D649 Anemia, unspecified: Secondary | ICD-10-CM | POA: Diagnosis not present

## 2018-08-29 SURGERY — Surgical Case
Anesthesia: Spinal

## 2018-08-29 MED ORDER — FENTANYL CITRATE (PF) 100 MCG/2ML IJ SOLN
INTRAMUSCULAR | Status: AC
  Filled 2018-08-29: qty 2

## 2018-08-29 MED ORDER — DIPHENHYDRAMINE HCL 25 MG PO CAPS
25.0000 mg | ORAL_CAPSULE | Freq: Four times a day (QID) | ORAL | Status: DC | PRN
  Administered 2018-08-30: 25 mg via ORAL
  Filled 2018-08-29: qty 1

## 2018-08-29 MED ORDER — WITCH HAZEL-GLYCERIN EX PADS: 1.0000 "application " | MEDICATED_PAD | CUTANEOUS | Status: DC | PRN

## 2018-08-29 MED ORDER — BUPIVACAINE HCL (PF) 0.25 % IJ SOLN
INTRAMUSCULAR | Status: AC
  Filled 2018-08-29: qty 30

## 2018-08-29 MED ORDER — MORPHINE SULFATE (PF) 0.5 MG/ML IJ SOLN
INTRAMUSCULAR | Status: DC | PRN
  Administered 2018-08-29: .15 mg via INTRATHECAL

## 2018-08-29 MED ORDER — NALOXONE HCL 0.4 MG/ML IJ SOLN: 0.4000 mg | INTRAMUSCULAR | Status: DC | PRN

## 2018-08-29 MED ORDER — CEFAZOLIN SODIUM-DEXTROSE 2-4 GM/100ML-% IV SOLN
INTRAVENOUS | Status: AC
  Filled 2018-08-29: qty 100

## 2018-08-29 MED ORDER — SENNOSIDES-DOCUSATE SODIUM 8.6-50 MG PO TABS
2.0000 | ORAL_TABLET | ORAL | Status: DC
  Administered 2018-08-30 (×2): 2 via ORAL
  Filled 2018-08-29 (×2): qty 2

## 2018-08-29 MED ORDER — MENTHOL 3 MG MT LOZG: 1.0000 | LOZENGE | OROMUCOSAL | Status: DC | PRN

## 2018-08-29 MED ORDER — LACTATED RINGERS IV SOLN
INTRAVENOUS | Status: DC
  Administered 2018-08-30: 01:00:00 via INTRAVENOUS

## 2018-08-29 MED ORDER — PHENYLEPHRINE HCL (PRESSORS) 10 MG/ML IV SOLN
INTRAVENOUS | Status: DC | PRN
  Administered 2018-08-29: 80 ug via INTRAVENOUS

## 2018-08-29 MED ORDER — DIPHENHYDRAMINE HCL 50 MG/ML IJ SOLN
INTRAMUSCULAR | Status: AC
  Filled 2018-08-29: qty 1

## 2018-08-29 MED ORDER — KETOROLAC TROMETHAMINE 30 MG/ML IJ SOLN
30.0000 mg | Freq: Four times a day (QID) | INTRAMUSCULAR | Status: AC
  Administered 2018-08-29 – 2018-08-30 (×3): 30 mg via INTRAVENOUS
  Filled 2018-08-29 (×3): qty 1

## 2018-08-29 MED ORDER — NALBUPHINE HCL 10 MG/ML IJ SOLN: 5.0000 mg | INTRAMUSCULAR | Status: DC | PRN

## 2018-08-29 MED ORDER — DEXAMETHASONE SODIUM PHOSPHATE 4 MG/ML IJ SOLN
INTRAMUSCULAR | Status: DC | PRN
  Administered 2018-08-29: 4 mg via INTRAVENOUS

## 2018-08-29 MED ORDER — FENTANYL CITRATE (PF) 100 MCG/2ML IJ SOLN
INTRAMUSCULAR | Status: DC | PRN
  Administered 2018-08-29: 15 ug via INTRATHECAL

## 2018-08-29 MED ORDER — PRENATAL MULTIVITAMIN CH
1.0000 | ORAL_TABLET | Freq: Every day | ORAL | Status: DC
  Administered 2018-08-30: 1 via ORAL
  Filled 2018-08-29: qty 1

## 2018-08-29 MED ORDER — DIBUCAINE (PERIANAL) 1 % EX OINT: 1.0000 "application " | TOPICAL_OINTMENT | CUTANEOUS | Status: DC | PRN

## 2018-08-29 MED ORDER — KETOROLAC TROMETHAMINE 30 MG/ML IJ SOLN: 30.0000 mg | Freq: Four times a day (QID) | INTRAMUSCULAR | Status: AC | PRN

## 2018-08-29 MED ORDER — ACETAMINOPHEN 500 MG PO TABS
1000.0000 mg | ORAL_TABLET | Freq: Four times a day (QID) | ORAL | Status: DC
  Administered 2018-08-29 – 2018-08-30 (×3): 1000 mg via ORAL
  Filled 2018-08-29 (×3): qty 2

## 2018-08-29 MED ORDER — SIMETHICONE 80 MG PO CHEW
80.0000 mg | CHEWABLE_TABLET | Freq: Three times a day (TID) | ORAL | Status: DC
  Administered 2018-08-29 – 2018-08-30 (×4): 80 mg via ORAL
  Filled 2018-08-29 (×4): qty 1

## 2018-08-29 MED ORDER — SCOPOLAMINE 1 MG/3DAYS TD PT72
1.0000 | MEDICATED_PATCH | Freq: Once | TRANSDERMAL | Status: DC
  Administered 2018-08-29: 1.5 mg via TRANSDERMAL

## 2018-08-29 MED ORDER — SODIUM CHLORIDE 0.9% FLUSH: 3.0000 mL | INTRAVENOUS | Status: DC | PRN

## 2018-08-29 MED ORDER — BUPIVACAINE HCL (PF) 0.25 % IJ SOLN
INTRAMUSCULAR | Status: DC | PRN
  Administered 2018-08-29: 30 mL

## 2018-08-29 MED ORDER — MORPHINE SULFATE (PF) 0.5 MG/ML IJ SOLN
INTRAMUSCULAR | Status: AC
  Filled 2018-08-29: qty 10

## 2018-08-29 MED ORDER — METHYLERGONOVINE MALEATE 0.2 MG/ML IJ SOLN: 0.2000 mg | INTRAMUSCULAR | Status: DC | PRN

## 2018-08-29 MED ORDER — PHENYLEPHRINE 40 MCG/ML (10ML) SYRINGE FOR IV PUSH (FOR BLOOD PRESSURE SUPPORT)
PREFILLED_SYRINGE | INTRAVENOUS | Status: AC
  Filled 2018-08-29: qty 10

## 2018-08-29 MED ORDER — PHENYLEPHRINE HCL-NACL 20-0.9 MG/250ML-% IV SOLN
INTRAVENOUS | Status: DC | PRN
  Administered 2018-08-29: 60 ug/min via INTRAVENOUS

## 2018-08-29 MED ORDER — ZOLPIDEM TARTRATE 5 MG PO TABS: 5.0000 mg | ORAL_TABLET | Freq: Every evening | ORAL | Status: DC | PRN

## 2018-08-29 MED ORDER — SIMETHICONE 80 MG PO CHEW: 80.0000 mg | CHEWABLE_TABLET | ORAL | Status: DC | PRN

## 2018-08-29 MED ORDER — OXYTOCIN 40 UNITS IN NORMAL SALINE INFUSION - SIMPLE MED: 2.5000 [IU]/h | INTRAVENOUS | Status: DC

## 2018-08-29 MED ORDER — LACTATED RINGERS IV SOLN
INTRAVENOUS | Status: DC
  Administered 2018-08-29 (×2): via INTRAVENOUS

## 2018-08-29 MED ORDER — FENTANYL CITRATE (PF) 100 MCG/2ML IJ SOLN: 25.0000 ug | INTRAMUSCULAR | Status: DC | PRN

## 2018-08-29 MED ORDER — CEFAZOLIN SODIUM-DEXTROSE 2-4 GM/100ML-% IV SOLN
2.0000 g | Freq: Once | INTRAVENOUS | Status: AC
  Administered 2018-08-29: 10:00:00 2 g via INTRAVENOUS

## 2018-08-29 MED ORDER — IBUPROFEN 800 MG PO TABS
800.0000 mg | ORAL_TABLET | Freq: Four times a day (QID) | ORAL | Status: DC
  Administered 2018-08-30 – 2018-08-31 (×4): 800 mg via ORAL
  Filled 2018-08-29 (×4): qty 1

## 2018-08-29 MED ORDER — SODIUM CHLORIDE 0.9 % IV SOLN
INTRAVENOUS | Status: DC | PRN
  Administered 2018-08-29: 10:00:00 via INTRAVENOUS

## 2018-08-29 MED ORDER — METHYLERGONOVINE MALEATE 0.2 MG PO TABS: 0.2000 mg | ORAL_TABLET | ORAL | Status: DC | PRN

## 2018-08-29 MED ORDER — SCOPOLAMINE 1 MG/3DAYS TD PT72
MEDICATED_PATCH | TRANSDERMAL | Status: AC
  Filled 2018-08-29: qty 1

## 2018-08-29 MED ORDER — LACTATED RINGERS IV SOLN: INTRAVENOUS | Status: DC

## 2018-08-29 MED ORDER — DIPHENHYDRAMINE HCL 25 MG PO CAPS: 25.0000 mg | ORAL_CAPSULE | ORAL | Status: DC | PRN

## 2018-08-29 MED ORDER — DEXAMETHASONE SODIUM PHOSPHATE 4 MG/ML IJ SOLN
INTRAMUSCULAR | Status: AC
  Filled 2018-08-29: qty 1

## 2018-08-29 MED ORDER — SODIUM CHLORIDE (PF) 0.9 % IJ SOLN
INTRAMUSCULAR | Status: AC
  Filled 2018-08-29: qty 20

## 2018-08-29 MED ORDER — SIMETHICONE 80 MG PO CHEW
80.0000 mg | CHEWABLE_TABLET | ORAL | Status: DC
  Administered 2018-08-30 (×2): 80 mg via ORAL
  Filled 2018-08-29 (×2): qty 1

## 2018-08-29 MED ORDER — NALBUPHINE HCL 10 MG/ML IJ SOLN: 5.0000 mg | Freq: Once | INTRAMUSCULAR | Status: DC | PRN

## 2018-08-29 MED ORDER — ONDANSETRON HCL 4 MG/2ML IJ SOLN: 4.0000 mg | Freq: Three times a day (TID) | INTRAMUSCULAR | Status: DC | PRN

## 2018-08-29 MED ORDER — PHENYLEPHRINE HCL-NACL 20-0.9 MG/250ML-% IV SOLN
INTRAVENOUS | Status: AC
  Filled 2018-08-29: qty 250

## 2018-08-29 MED ORDER — TETANUS-DIPHTH-ACELL PERTUSSIS 5-2.5-18.5 LF-MCG/0.5 IM SUSP: 0.5000 mL | Freq: Once | INTRAMUSCULAR | Status: DC

## 2018-08-29 MED ORDER — ONDANSETRON HCL 4 MG/2ML IJ SOLN
INTRAMUSCULAR | Status: AC
  Filled 2018-08-29: qty 2

## 2018-08-29 MED ORDER — MEPERIDINE HCL 25 MG/ML IJ SOLN: 6.2500 mg | INTRAMUSCULAR | Status: DC | PRN

## 2018-08-29 MED ORDER — COCONUT OIL OIL: 1.0000 "application " | TOPICAL_OIL | Status: DC | PRN

## 2018-08-29 MED ORDER — METOCLOPRAMIDE HCL 5 MG/ML IJ SOLN: 10.0000 mg | Freq: Once | INTRAMUSCULAR | Status: DC | PRN

## 2018-08-29 MED ORDER — DIPHENHYDRAMINE HCL 50 MG/ML IJ SOLN
12.5000 mg | INTRAMUSCULAR | Status: DC | PRN
  Administered 2018-08-29 (×2): 12.5 mg via INTRAVENOUS
  Filled 2018-08-29: qty 1

## 2018-08-29 MED ORDER — OXYTOCIN 40 UNITS IN NORMAL SALINE INFUSION - SIMPLE MED
INTRAVENOUS | Status: AC
  Filled 2018-08-29: qty 1000

## 2018-08-29 MED ORDER — NALOXONE HCL 4 MG/10ML IJ SOLN
1.0000 ug/kg/h | INTRAVENOUS | Status: DC | PRN
  Filled 2018-08-29: qty 5

## 2018-08-29 MED ORDER — SODIUM CHLORIDE 0.9 % IV SOLN
INTRAVENOUS | Status: DC | PRN
  Administered 2018-08-29: 10:00:00 40 [IU] via INTRAVENOUS

## 2018-08-29 MED ORDER — BUPIVACAINE IN DEXTROSE 0.75-8.25 % IT SOLN
INTRATHECAL | Status: DC | PRN
  Administered 2018-08-29: 1.4 mL via INTRATHECAL

## 2018-08-29 MED ORDER — ONDANSETRON HCL 4 MG/2ML IJ SOLN
INTRAMUSCULAR | Status: DC | PRN
  Administered 2018-08-29: 4 mg via INTRAVENOUS

## 2018-08-29 MED ORDER — OXYCODONE-ACETAMINOPHEN 5-325 MG PO TABS: 1.0000 | ORAL_TABLET | ORAL | Status: DC | PRN

## 2018-08-29 SURGICAL SUPPLY — 35 items
BENZOIN TINCTURE PRP APPL 2/3 (GAUZE/BANDAGES/DRESSINGS) ×2 IMPLANT
CHLORAPREP W/TINT 26ML (MISCELLANEOUS) ×2 IMPLANT
CLAMP CORD UMBIL (MISCELLANEOUS) IMPLANT
CLOTH BEACON ORANGE TIMEOUT ST (SAFETY) ×2 IMPLANT
DRSG OPSITE POSTOP 4X10 (GAUZE/BANDAGES/DRESSINGS) ×2 IMPLANT
ELECT REM PT RETURN 9FT ADLT (ELECTROSURGICAL) ×2
ELECTRODE REM PT RTRN 9FT ADLT (ELECTROSURGICAL) ×1 IMPLANT
EXTRACTOR VACUUM M CUP 4 TUBE (SUCTIONS) IMPLANT
GLOVE BIO SURGEON STRL SZ7.5 (GLOVE) ×2 IMPLANT
GLOVE BIOGEL PI IND STRL 7.0 (GLOVE) ×1 IMPLANT
GLOVE BIOGEL PI INDICATOR 7.0 (GLOVE) ×1
GOWN STRL REUS W/TWL LRG LVL3 (GOWN DISPOSABLE) ×4 IMPLANT
KIT ABG SYR 3ML LUER SLIP (SYRINGE) IMPLANT
NEEDLE HYPO 22GX1.5 SAFETY (NEEDLE) ×2 IMPLANT
NEEDLE HYPO 25X5/8 SAFETYGLIDE (NEEDLE) IMPLANT
NEEDLE SPNL 20GX3.5 QUINCKE YW (NEEDLE) IMPLANT
NS IRRIG 1000ML POUR BTL (IV SOLUTION) ×2 IMPLANT
PACK C SECTION WH (CUSTOM PROCEDURE TRAY) ×2 IMPLANT
PENCIL SMOKE EVAC W/HOLSTER (ELECTROSURGICAL) ×2 IMPLANT
STRIP CLOSURE SKIN 1/2X4 (GAUZE/BANDAGES/DRESSINGS) ×2 IMPLANT
SUT MNCRL 0 VIOLET CTX 36 (SUTURE) ×2 IMPLANT
SUT MNCRL AB 3-0 PS2 27 (SUTURE) IMPLANT
SUT MON AB 2-0 CT1 27 (SUTURE) ×2 IMPLANT
SUT MON AB-0 CT1 36 (SUTURE) ×4 IMPLANT
SUT MONOCRYL 0 CTX 36 (SUTURE) ×2
SUT PLAIN 0 NONE (SUTURE) IMPLANT
SUT PLAIN 2 0 (SUTURE)
SUT PLAIN 2 0 XLH (SUTURE) IMPLANT
SUT PLAIN ABS 2-0 CT1 27XMFL (SUTURE) IMPLANT
SUT VIC AB 4-0 KS 27 (SUTURE) ×2 IMPLANT
SYR 20CC LL (SYRINGE) IMPLANT
SYR CONTROL 10ML LL (SYRINGE) ×2 IMPLANT
TOWEL OR 17X24 6PK STRL BLUE (TOWEL DISPOSABLE) ×2 IMPLANT
TRAY FOLEY W/BAG SLVR 14FR LF (SET/KITS/TRAYS/PACK) ×2 IMPLANT
WATER STERILE IRR 1000ML POUR (IV SOLUTION) ×2 IMPLANT

## 2018-08-29 NOTE — Op Note (Signed)
Cesarean Section Procedure Note  Indications: previous uterine incision kerr x one and elective sterilization  Pre-operative Diagnosis: 39 week 2 day pregnancy.  Post-operative Diagnosis: same  Surgeon: Lovenia Kim   Assistants: Janann Colonel, CNM  Anesthesia: Local anesthesia 0.25.% bupivacaine and Spinal anesthesia  ASA Class: 2  Procedure Details  The patient was seen in the Holding Room. The risks, benefits, complications, treatment options, and expected outcomes were discussed with the patient.  The patient concurred with the proposed plan, giving informed consent. The risks of anesthesia, infection, bleeding and possible injury to other organs discussed. Injury to bowel, bladder, or ureter with possible need for repair discussed. Possible need for transfusion with secondary risks of hepatitis or HIV acquisition discussed. Post operative complications to include but not limited to DVT, PE and Pneumonia noted. The site of surgery properly noted/marked. The patient was taken to Operating Room # C, identified as Megan Bowman and the procedure verified as C-Section Delivery. A Time Out was held and the above information confirmed.  After induction of anesthesia, the patient was draped and prepped in the usual sterile manner. A Pfannenstiel incision was made and carried down through the subcutaneous tissue to the fascia. Fascial incision was made and extended transversely using Mayo scissors. The fascia was separated from the underlying rectus tissue superiorly and inferiorly. The peritoneum was identified and entered. Peritoneal incision was extended longitudinally. The utero-vesical peritoneal reflection was incised transversely and the bladder flap was bluntly freed from the lower uterine segment. A low transverse uterine incision(Kerr hysterotomy) was made. Delivered from OA  presentation was a  female with Apgar scores of 8 at one minute and 9 at five minutes. Bulb suctioning  gently performed. Neonatal team in attendance.After the umbilical cord was clamped and cut cord blood was obtained for evaluation. The placenta was removed intact and appeared normal. The uterus was curetted with a dry lap pack. Good hemostasis was noted.The uterine outline, tubes and ovaries appeared normal. The uterine incision was closed with running locked sutures of 0 Monocryl x 1 layers. Bilateral O'Leary sutures placed.Hemostasis was observed. Perforating artery in right rectus sheath bleeding with response to pressure and electrocautery. Bilateral Modified Pomeroy tubal ligation. Tubal segments to pathology.The parietal peritoneum was closed with a running 2-0 Monocryl suture. The fascia was then reapproximated with running sutures of 0 Monocryl. The skin was reapproximated with 4-0 vicryl after Clarendon closure with 2-0 plan..  Instrument, sponge, and needle counts were correct prior the abdominal closure and at the conclusion of the case.   Findings: FTLF, OA, anterior placenta. Nl adnexa. Nl ovaries.   Estimated Blood Loss:  500         Drains: foley                 Specimens: placenta and tubal segments                 Complications:  None; patient tolerated the procedure well.         Disposition: PACU - hemodynamically stable.         Condition: stable  Attending Attestation: I performed the procedure.

## 2018-08-29 NOTE — Anesthesia Preprocedure Evaluation (Addendum)
Anesthesia Evaluation  Patient identified by MRN, date of birth, ID band Patient awake    Reviewed: Allergy & Precautions, H&P , NPO status , Patient's Chart, lab work & pertinent test results  Airway Mallampati: II  TM Distance: >3 FB Neck ROM: full    Dental no notable dental hx. (+) Teeth Intact   Pulmonary neg pulmonary ROS, former smoker,    Pulmonary exam normal breath sounds clear to auscultation       Cardiovascular negative cardio ROS Normal cardiovascular exam Rhythm:regular Rate:Normal     Neuro/Psych negative neurological ROS  negative psych ROS   GI/Hepatic negative GI ROS, Neg liver ROS,   Endo/Other  Morbid obesity  Renal/GU negative Renal ROS  negative genitourinary   Musculoskeletal negative musculoskeletal ROS (+)   Abdominal (+) + obese,   Peds  Hematology negative hematology ROS (+)   Anesthesia Other Findings   Reproductive/Obstetrics (+) Pregnancy                             Anesthesia Physical  Anesthesia Plan  ASA: III  Anesthesia Plan: Spinal   Post-op Pain Management:    Induction:   PONV Risk Score and Plan: 2 and Ondansetron, Dexamethasone and Treatment may vary due to age or medical condition  Airway Management Planned: Natural Airway  Additional Equipment:   Intra-op Plan:   Post-operative Plan:   Informed Consent: I have reviewed the patients History and Physical, chart, labs and discussed the procedure including the risks, benefits and alternatives for the proposed anesthesia with the patient or authorized representative who has indicated his/her understanding and acceptance.     Dental advisory given  Plan Discussed with:   Anesthesia Plan Comments:        Anesthesia Quick Evaluation

## 2018-08-29 NOTE — Anesthesia Procedure Notes (Signed)
Spinal  Patient location during procedure: OR Staffing Anesthesiologist: Montez Hageman, MD Performed: anesthesiologist  Preanesthetic Checklist Completed: patient identified, site marked, surgical consent, pre-op evaluation, timeout performed, IV checked, risks and benefits discussed and monitors and equipment checked Spinal Block Patient position: sitting Prep: DuraPrep Patient monitoring: heart rate, continuous pulse ox and blood pressure Approach: midline Location: L4-5 Injection technique: single-shot Needle Needle type: Sprotte  Needle gauge: 24 G Needle length: 9 cm Additional Notes Expiration date of kit checked and confirmed. Patient tolerated procedure well, without complications.

## 2018-08-29 NOTE — Anesthesia Postprocedure Evaluation (Addendum)
Anesthesia Post Note  Patient: Megan Bowman  Procedure(s) Performed: Repeat CESAREAN SECTION (N/A )     Patient location during evaluation: PACU Anesthesia Type: Spinal Level of consciousness: awake and alert Pain management: pain level controlled Vital Signs Assessment: post-procedure vital signs reviewed and stable Respiratory status: spontaneous breathing and respiratory function stable Cardiovascular status: blood pressure returned to baseline and stable Postop Assessment: no headache, no backache, no apparent nausea or vomiting and spinal receding Anesthetic complications: no    Last Vitals:  Vitals:   08/29/18 1144 08/29/18 1156  BP: 111/81 110/85  Pulse: 78 77  Resp: 16 18  Temp:  36.5 C  SpO2: 99% 99%    Last Pain:  Vitals:   08/29/18 1156  TempSrc: Oral   Pain Goal:    LLE Motor Response: Purposeful movement (08/29/18 1144) LLE Sensation: Tingling (08/29/18 1144) RLE Motor Response: Purposeful movement (08/29/18 1144) RLE Sensation: Tingling (08/29/18 1144)     Epidural/Spinal Function Cutaneous sensation: Tingles (08/29/18 1144), Patient able to flex knees: Yes (08/29/18 1144), Patient able to lift hips off bed: No (08/29/18 1144), Back pain beyond tenderness at insertion site: No (08/29/18 1144), Progressively worsening motor and/or sensory loss: No (08/29/18 1144), Bowel and/or bladder incontinence post epidural: No (08/29/18 1144)  Montez Hageman

## 2018-08-29 NOTE — Transfer of Care (Signed)
Immediate Anesthesia Transfer of Care Note  Patient: Megan Bowman  Procedure(s) Performed: Repeat CESAREAN SECTION (N/A )  Patient Location: PACU  Anesthesia Type:Spinal  Level of Consciousness: awake, alert  and patient cooperative  Airway & Oxygen Therapy: Patient Spontanous Breathing  Post-op Assessment: Report given to RN and Post -op Vital signs reviewed and stable  Post vital signs: Reviewed and stable  Last Vitals:  Vitals Value Taken Time  BP 112/67 08/29/18 1044  Temp 97.7 08/29/18  1044  Pulse 88 08/29/18 1044  Resp 22 08/29/18 1044  SpO2 98 % 08/29/18 1044  Vitals shown include unvalidated device data.  Last Pain:  Vitals:   08/29/18 0806  TempSrc: Oral         Complications: No apparent anesthesia complications

## 2018-08-30 ENCOUNTER — Encounter (HOSPITAL_COMMUNITY): Payer: Self-pay | Admitting: Obstetrics and Gynecology

## 2018-08-30 LAB — CBC
HCT: 35.2 % — ABNORMAL LOW (ref 36.0–46.0)
Hemoglobin: 11.9 g/dL — ABNORMAL LOW (ref 12.0–15.0)
MCH: 34.4 pg — ABNORMAL HIGH (ref 26.0–34.0)
MCHC: 33.8 g/dL (ref 30.0–36.0)
MCV: 101.7 fL — ABNORMAL HIGH (ref 80.0–100.0)
Platelets: 154 10*3/uL (ref 150–400)
RBC: 3.46 MIL/uL — ABNORMAL LOW (ref 3.87–5.11)
RDW: 14 % (ref 11.5–15.5)
WBC: 13.9 10*3/uL — ABNORMAL HIGH (ref 4.0–10.5)
nRBC: 0 % (ref 0.0–0.2)

## 2018-08-30 NOTE — Addendum Note (Signed)
Addendum  created 08/30/18 0953 by Montez Hageman, MD   Clinical Note Signed, SmartForm saved

## 2018-08-30 NOTE — Lactation Note (Signed)
This note was copied from a baby's chart. Lactation Consultation Note  Patient Name: Megan Bowman [id number] Date: 08/30/2018 Reason for consult: Follow-up assessment;Mother's request  Ms. Divelbiss paged for latch assistance. I helped her hand express colostrum onto her right nipple and onto a size 24 nipple shield which we placed over the nipple. I gently woke her daughter and positioned her into football hold with the use of support pillows. Baby latched with some good suckling sequences. She seemed a bit disorganized and would release the breast and then find it again.  Parents gave baby about 10 ml of formula around 1945 using a bottle nipple. Dad explained that with their previous child, now two, they had difficulty with latching and they did numerous interventions until mom ultimately formula fed that baby. She feels that her current daughter is latching more consistently.  Ms. Chevere also has been pumping. She recently fed her daughter 10 mls of her pumped milk.  I recommended that she try to breast feed first (pre-pump and/or hand express first to help draw out her nipples), then latch baby using a nipple shield (she is welcome to try without, but I advised her to use the shield if baby becomes frustrated), and then pump afterwards. If baby does not breast feed, and parents choose to supplement, I suggested that she use a cup or spoon to help protect baby's latch. Baby is showing some disorganization at the breast which may be attributed to expectations for a fast flow.  Dad states that baby takes a bottle quickly. I explained the concept of paced bottle feeding and showed dad how to use a foley cup as an alternative to a bottle nipple. Parents understand the plan for the evening and had no further questions at this time.  Maternal Data Formula Feeding for Exclusion: No Has patient been taught Hand Expression?: Yes Does the patient have breastfeeding experience prior to this delivery?:  Yes  Feeding Feeding Type: Breast Fed Nipple Type: Slow - flow  LATCH Score Latch: Repeated attempts needed to sustain latch, nipple held in mouth throughout feeding, stimulation needed to elicit sucking reflex.  Audible Swallowing: A few with stimulation  Type of Nipple: Everted at rest and after stimulation  Comfort (Breast/Nipple): Soft / non-tender  Hold (Positioning): Assistance needed to correctly position infant at breast and maintain latch.  LATCH Score: 7  Interventions Interventions: Breast feeding basics reviewed;Assisted with latch;Skin to skin;Hand express;Adjust position;Support pillows  Lactation Tools Discussed/Used Tools: Nipple [place] Fuel;Feeding cup Nipple shield size: 24 Breast pump type: Double-Electric Breast Pump Pump Review: Setup, frequency, and cleaning   Consult Status Consult Status: Follow-up Date: 08/31/18 Follow-up type: In-patient    Lenore Manner 08/30/2018, 9:50 PM

## 2018-08-30 NOTE — Lactation Note (Signed)
This note was copied from a baby's chart. Lactation Consultation Note  Patient Name: Megan Bowman [id number] Date: 08/30/2018 Reason for consult: Infant weight loss;Initial assessment;Term  17 hours old FT female who is still being exclusively BF by her mother, she's a P2 and not very experienced BF. Mom had supply issues with her first baby and she only BF for one week. She reported (+) breast changes during this pregnancy. She's already using a NS #24 and says that's the only way to get baby to latch to the breast, RN has already set her up with a DEBP, LC adjusted junctures on the pump mom noticed a difference in the suction pattern afterwards. She has a DEBP at home.  Mom already pumping when entering the room, noticed that she was already getting some colostrum from her left breast, praised her for her efforts. Her left breast is getting sore, noticed some minor cracking. LC revised hand expression with mom and rubbed some colostrum, it was easily flowing out of both breasts. Mom chooses to use her own nipple butter instead of coconut oil, she'll do it prior pumping.     Offered assistance with latch, but mom told LC her baby wasn't due for another feeding, she was asleep, but she mentioned that baby has been cluster feeding today. Asked mom to call for assistance on the next feeding or any posterior feeding to observe a latch, she's been applying the NS without getting it wet and the cracking also suggests poor positioning. Parents already requested formula, Enfamil formula and slow flow nipples were brought to the room "just in case" but mom wants to try BF first, she said that this baby is a better feeder than her oldest one and wanted to give her a chance.   Parents were very engaged during [facility] consultation and had lots of questions. Normal newborn behavior, cluster feeding, feeding cues, lactogenesis I, lactogenesis II, pumping schedule, milk storage guidelines, prevention and treatment for  sore nipples, and supplementation guidelines were reviewed.  Feeding plan:  1. Encouraged mom to keep feeding baby STS 8-12 times/24 hours or sooner if feeding cues are present. Will use NS # 24 as needed 2. Mom will pump after feedings and will use her nipple butter prior pumping and her own colostrum for breast care 3. Parents will offer any amount of EBM they may get, and if baby still seems to be unsatisfied after supplementation with EBM and feedings at the breast, they'll start supplementation with Enfamil formula according to baby's age in hours  BF brochure, BF resources and feeding diary were reviewed. Parents reported all questions and concerns were answered, they're both aware of [facility] services and will call PRN.  Maternal Data Formula Feeding for Exclusion: Yes Reason for exclusion: Mother's choice to formula and breast feed on admission Has patient been taught Hand Expression?: Yes Does the patient have breastfeeding experience prior to this delivery?: Yes  Feeding Feeding Type: Breast Fed  LATCH Score Latch: Repeated attempts needed to sustain latch, nipple held in mouth throughout feeding, stimulation needed to elicit sucking reflex.  Audible Swallowing: None  Type of Nipple: Everted at rest and after stimulation  Comfort (Breast/Nipple): Soft / non-tender  Hold (Positioning): Assistance needed to correctly position infant at breast and maintain latch.  LATCH Score: 6  Interventions Interventions: Breast feeding basics reviewed;Breast massage;Hand express;Breast compression;DEBP  Lactation Tools Discussed/Used Tools: Pump;Nipple Shields Nipple shield size: 24 Breast pump type: Double-Electric Breast Pump WIC Program: No Pump  Review: Setup, frequency, and cleaning;Milk Storage Initiated by:: RN, and MP (adjusted junctures on the tubing) Date initiated:: 08/30/18   Consult Status Consult Status: Follow-up Date: 08/30/18 Follow-up type:  In-patient    Rheba Diamond Venetia ConstableS Karlita Lichtman 08/30/2018, 1:57 PM

## 2018-08-30 NOTE — Progress Notes (Signed)
No c/o; tol po; voiding w/o difficulty; pain controlled; ambulating some No flatus but feels ready  Temp:  [97.7 F (36.5 C)-98.7 F (37.1 C)] 98.1 F (36.7 C) (08/20 0521) Pulse Rate:  [72-94] 82 (08/20 0521) Resp:  [13-20] 18 (08/20 0521) BP: (85-117)/(50-85) 102/67 (08/20 0521) SpO2:  [94 %-99 %] 99 % (08/20 0521) Weight:  [89.2 kg] 89.2 kg (08/19 0806)   Intake/Output Summary (Last 24 hours) at 08/30/2018 0701 Last data filed at 08/30/2018 0600 Gross per 24 hour  Intake 2792.58 ml  Output 3031 ml  Net -238.42 ml   A&ox3 rrr ctab Abd: soft, nt, nd; +bs; dressing c/d/i; fundus firm and 1cm below umb  CBC Latest Ref Rng & Units 08/30/2018 08/27/2018 07/08/2016  WBC 4.0 - 10.5 K/uL 13.9(H) 9.3 13.2(H)  Hemoglobin 12.0 - 15.0 g/dL 11.9(L) 12.7 10.6(L)  Hematocrit 36.0 - 46.0 % 35.2(L) 37.4 31.5(L)  Platelets 150 - 400 K/uL 154 150 150   A/P: pod 1 s/p rltcs 1. Doing well; increase ambulation today; contin current care 2. Acute anemia- asymptomatic plan iron q day pp

## 2018-08-31 MED ORDER — OXYCODONE-ACETAMINOPHEN 5-325 MG PO TABS: 1.0000 | ORAL_TABLET | ORAL | 0 refills | Status: AC | PRN

## 2018-08-31 MED ORDER — ACETAMINOPHEN 500 MG PO TABS: 1000.0000 mg | ORAL_TABLET | Freq: Four times a day (QID) | ORAL | 2 refills | Status: AC | PRN

## 2018-08-31 MED ORDER — ACETAMINOPHEN 500 MG PO TABS
1000.0000 mg | ORAL_TABLET | Freq: Four times a day (QID) | ORAL | Status: DC | PRN
  Administered 2018-08-31: 1000 mg via ORAL
  Filled 2018-08-31: qty 2

## 2018-08-31 MED ORDER — COCONUT OIL OIL: 1.0000 "application " | TOPICAL_OIL | 0 refills | Status: AC | PRN

## 2018-08-31 MED ORDER — SENNOSIDES-DOCUSATE SODIUM 8.6-50 MG PO TABS: 2.0000 | ORAL_TABLET | ORAL | Status: AC

## 2018-08-31 MED ORDER — IBUPROFEN 800 MG PO TABS: 800.0000 mg | ORAL_TABLET | Freq: Four times a day (QID) | ORAL | 0 refills | Status: AC

## 2018-08-31 MED ORDER — PRENATAL MULTIVITAMIN CH: 1.0000 | ORAL_TABLET | Freq: Every day | ORAL | Status: AC

## 2018-08-31 MED ORDER — SIMETHICONE 80 MG PO CHEW: 80.0000 mg | CHEWABLE_TABLET | ORAL | 0 refills | Status: AC | PRN

## 2018-08-31 NOTE — Discharge Summary (Signed)
OB Discharge Summary  Patient Name: Megan Bowman DOB: 10/14/1985 MRN: [id number]  Date of admission: 08/29/2018 Delivering provider: Olivia MackieAAVON, Bowman   Date of discharge: 08/31/2018  Admitting diagnosis: Previous Cesarean Section Intrauterine pregnancy: 9333w2d     Secondary diagnosis:Principal Problem:   Postpartum care following cesarean delivery with BTL (8/19) Active Problems:   Previous cesarean delivery (planned repeat CS)  Additional problems:none     Discharge diagnosis:  Patient Active Problem List   Diagnosis Date Noted  . Previous cesarean delivery (planned repeat CS) 08/29/2018  . Postpartum care following cesarean delivery with BTL (8/19) 08/29/2018                                                                Post partum procedures:none   Pain control: Spinal   Complications: None   Hospital course:  Sceduled C/S   33 y.o. yo G2P2002 at 6833w2d was admitted to the hospital 08/29/2018 for scheduled cesarean section with the following indication:Elective Repeat.  Membrane Rupture Time/Date: 10:00 AM ,08/29/2018   Patient delivered a Viable infant.08/29/2018  Details of operation can be found in separate operative note.  Pateint had an uncomplicated postpartum course.  She is ambulating, tolerating a regular diet, passing flatus, and urinating well. Patient is discharged home in stable condition on  08/31/18         Physical exam  Vitals:   08/30/18 1359 08/30/18 2155 08/30/18 2156 08/31/18 0550  BP: 98/67 105/79 105/79 120/74  Pulse: 72 76 76 82  Resp: 18   17  Temp: 97.9 F (36.6 C) 97.8 F (36.6 C) 97.8 F (36.6 C) 97.8 F (36.6 C)  TempSrc: Oral Oral Oral Oral  SpO2: 98% 99%    Weight:      Height:       General: alert, cooperative and no distress Lochia: appropriate Uterine Fundus: firm Incision: Healing well with no significant drainage DVT Evaluation: No cords or calf tenderness. No significant calf/ankle edema. Labs: Lab Results   Component Value Date   WBC 13.9 (H) 08/30/2018   HGB 11.9 (L) 08/30/2018   HCT 35.2 (L) 08/30/2018   MCV 101.7 (H) 08/30/2018   PLT 154 08/30/2018   No flowsheet data found.  Vaccines: TDaP UTD         Flu    NA  Discharge instruction: per After Visit Summary and "Baby and Me Booklet".  After Visit Meds:  Allergies as of 08/31/2018      Reactions   Amoxicillin Diarrhea, Nausea And Vomiting   Has patient had a PCN reaction causing immediate rash, facial/tongue/throat swelling, SOB or lightheadedness with hypotension: Unknown Has patient had a PCN reaction causing severe rash involving mucus membranes or skin necrosis: Unknown Has patient had a PCN reaction that required hospitalization: Unknown Has patient had a PCN reaction occurring within the last 10 years: Unknown If all of the above answers are "NO", then may proceed with Cephalosporin use.      Medication List    STOP taking these medications   valACYclovir 500 MG tablet Commonly known as: VALTREX     TAKE these medications   acetaminophen 500 MG tablet Commonly known as: Tylenol Take 2 tablets (1,000 mg total) by mouth every 6 (six) hours as needed.   cetirizine  10 MG tablet Commonly known as: ZYRTEC Take 10 mg by mouth daily.   coconut oil Oil Apply 1 application topically as needed.   ibuprofen 800 MG tablet Commonly known as: ADVIL Take 1 tablet (800 mg total) by mouth every 6 (six) hours.   oxyCODONE-acetaminophen 5-325 MG tablet Commonly known as: PERCOCET/ROXICET Take 1-2 tablets by mouth every 4 (four) hours as needed for moderate pain.   PRENATAL DHA PO Take 1 capsule by mouth daily.   prenatal multivitamin Tabs tablet Take 1 tablet by mouth daily at 12 noon.   senna-docusate 8.6-50 MG tablet Commonly known as: Senokot-S Take 2 tablets by mouth daily. Start taking on: September 01, 2018   simethicone 80 MG chewable tablet Commonly known as: MYLICON Chew 1 tablet (80 mg total) by mouth as  needed for flatulence.       Diet: routine diet  Activity: Advance as tolerated. Pelvic rest for 6 weeks.   Postpartum contraception: to be addressed at postpartum visit, condoms in meantime  Newborn Data: Live born female  Birth Weight: 7 lb 4.2 oz (3295 g) APGAR: 55, 9  Newborn Delivery   Birth date/time: 08/29/2018 10:01:00 Delivery type: C-Section, Low Transverse Trial of labor: No C-section categorization: Repeat      named [place] Baby Feeding: Bottle and Breast Disposition:home with mother   Delivery Report:  Review the Delivery Report for details.    Follow up: Follow-up Information    Brien Few, MD. Go in 6 week(s).   Specialty: Obstetrics and Gynecology Contact information: [place] [address] [phone]             Signed: Otilio Carpen, MSN 08/31/2018, 9:53 AM

## 2018-09-03 ENCOUNTER — Inpatient Hospital Stay (HOSPITAL_COMMUNITY): Admit: 2018-09-03 | Payer: Self-pay

## 2018-10-09 DIAGNOSIS — Z124 Encounter for screening for malignant neoplasm of cervix: Secondary | ICD-10-CM | POA: Diagnosis not present

## 2018-10-09 DIAGNOSIS — Z1151 Encounter for screening for human papillomavirus (HPV): Secondary | ICD-10-CM | POA: Diagnosis not present

## 2020-01-20 DIAGNOSIS — M62830 Muscle spasm of back: Secondary | ICD-10-CM | POA: Diagnosis not present

## 2020-02-01 IMAGING — US US MFM OB DETAIL +14 WK
1 series · 13 of 28 positions shown · non-contrast
Comparison: none

[Series 1: us mfm ob detail +14 wk · 93 acquisitions, 13 frames shown]
[im 4/93]
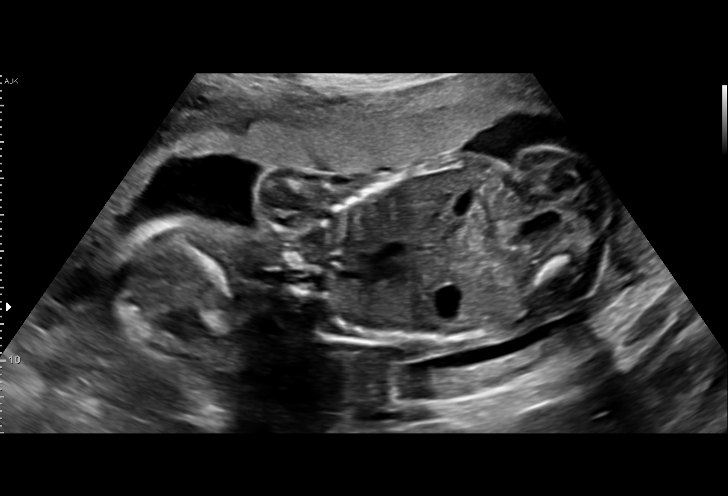
[im 11/93]
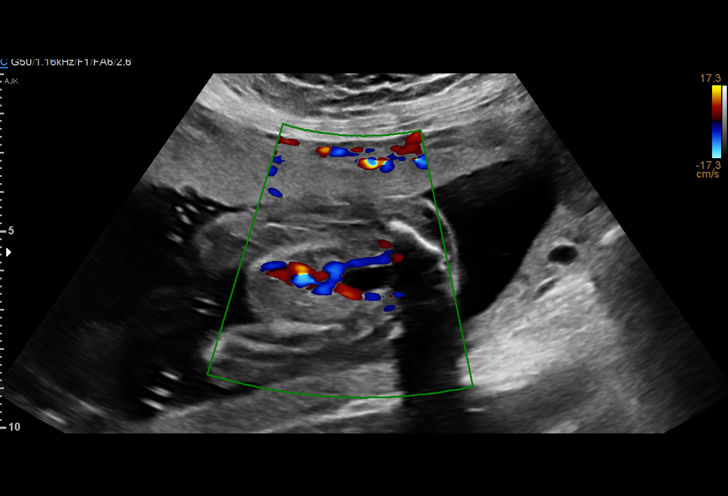
[im 18/93]
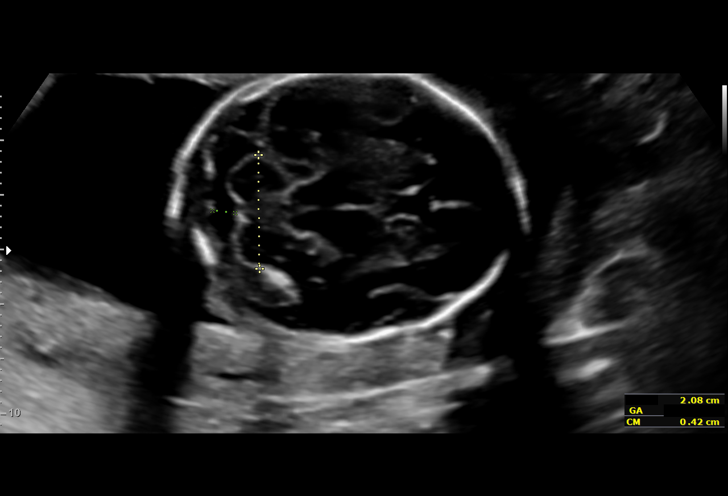
[im 24/93]
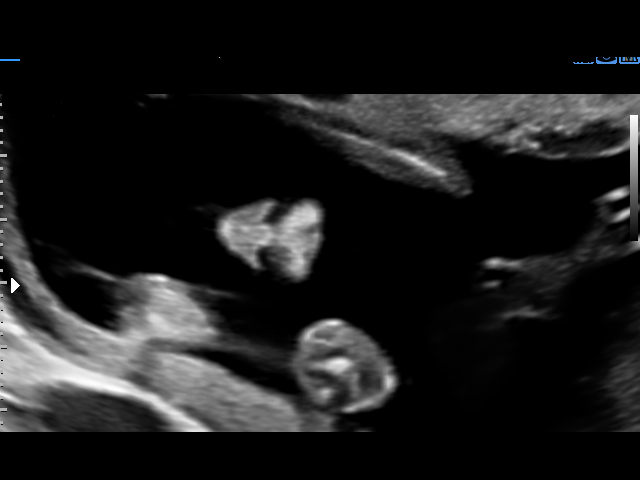
[im 31/93]
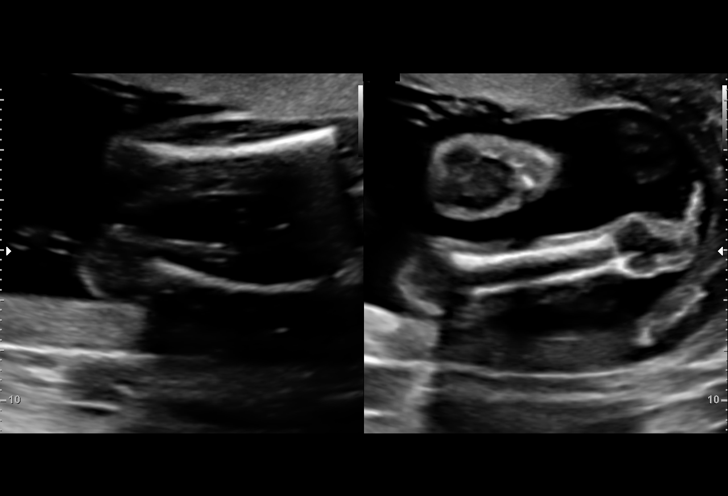
[im 38/93]
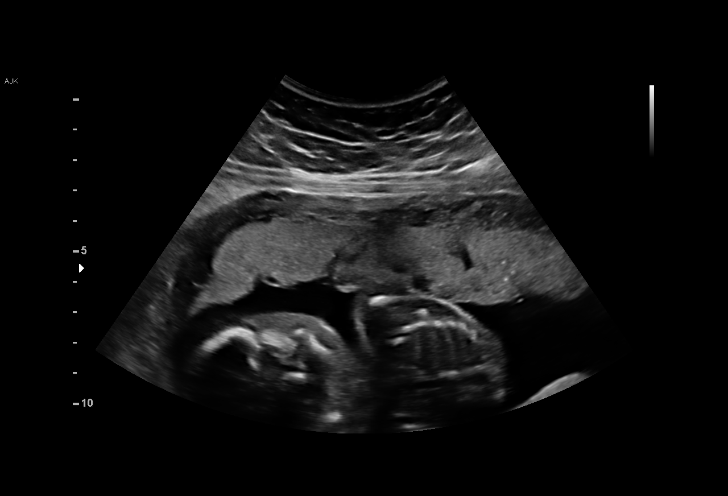
[im 48/93]
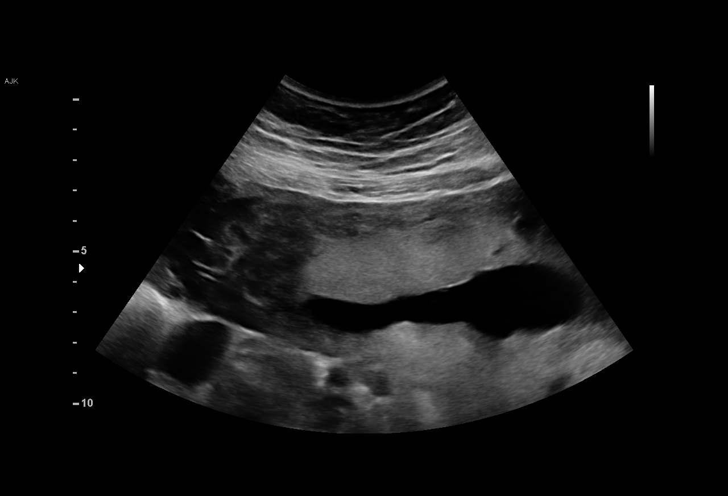
[im 55/93]
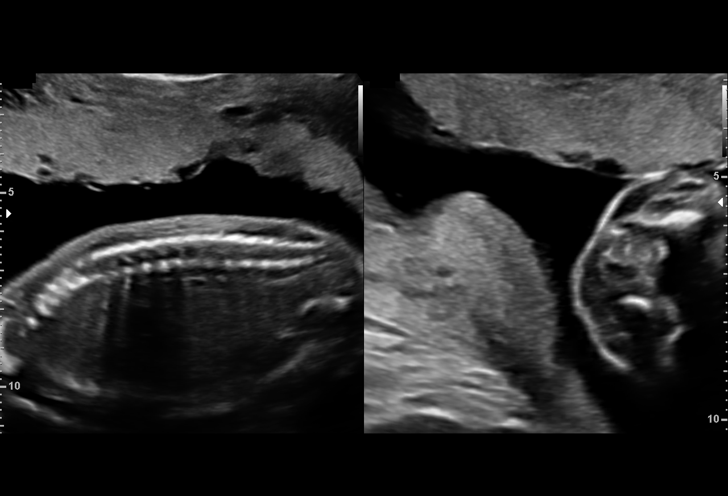
[im 62/93]
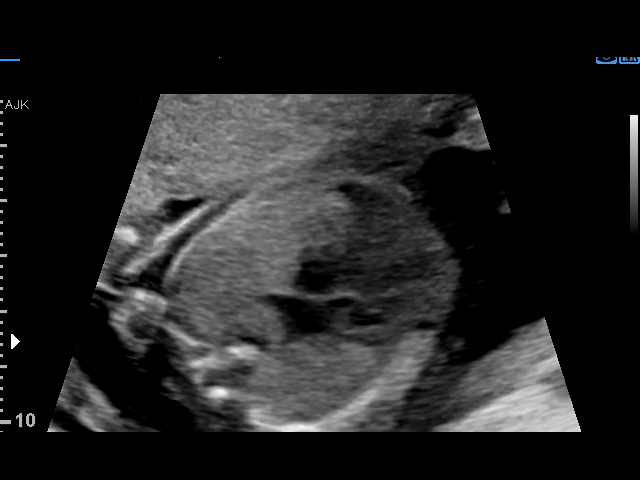
[im 69/93]
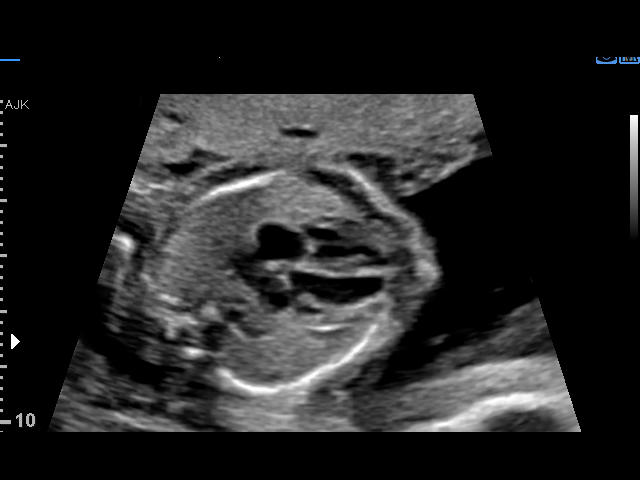
[im 75/93]
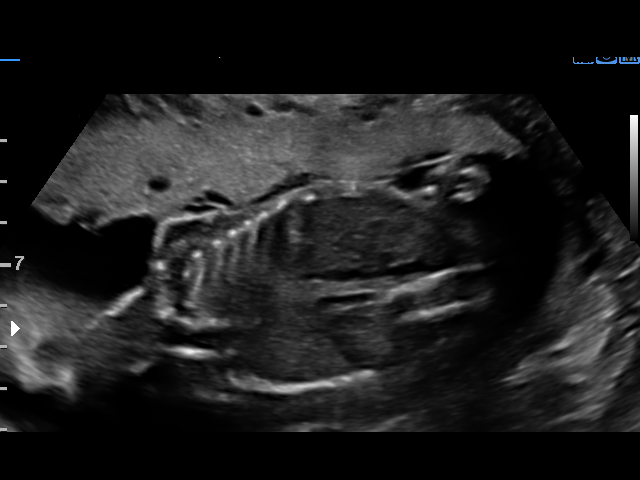
[im 82/93]
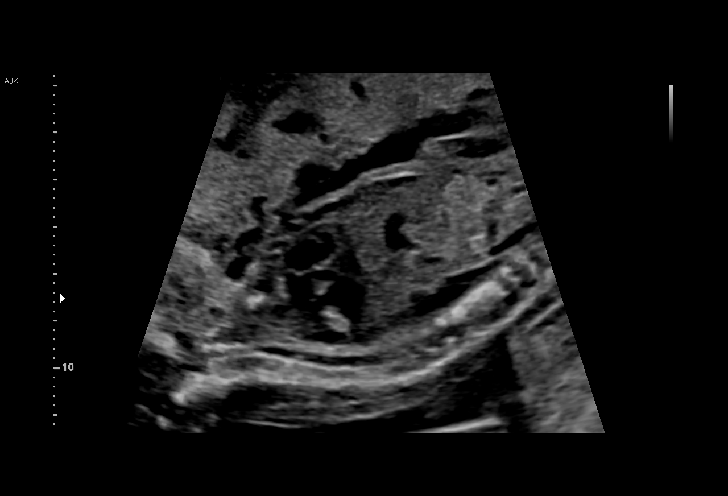
[im 89/93]
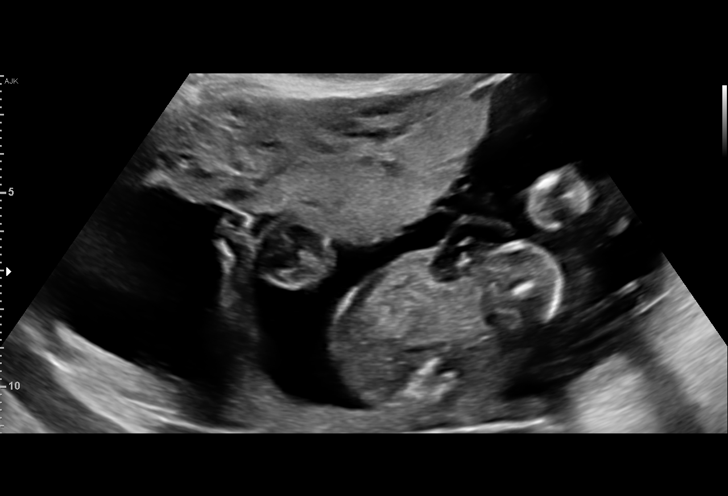

[13 of 28 positions shown; findings below may reference images not displayed]

& Infertility
                                                            [id number] [REDACTED]

 ----------------------------------------------------------------------

 ----------------------------------------------------------------------
Indications

  Maternal morbid obesity
  Encounter for antenatal screening for
  malformations (Jim, Chelsie)
  Previous cesarean delivery, antepartum
  22 weeks gestation of pregnancy
  Fetal abnormality - other known or
  suspected (echogenic bowel)
 ----------------------------------------------------------------------
Vital Signs

 BMI:
Fetal Evaluation

 Num Of Fetuses:         1
 Fetal Heart Rate(bpm):  143
 Cardiac Activity:       Observed
 Presentation:           Variable
 Placenta:               Anterior
 P. Cord Insertion:      Visualized, central

 Amniotic Fluid
 AFI FV:      Within normal limits

                             Largest Pocket(cm)

Biometry
 BPD:      49.5  mm     G. Age:  21w 0d         13  %    CI:         72.3   %    70 - 86
                                                         FL/HC:      19.0   %    18.4 -
 HC:      185.2  mm     G. Age:  20w 6d          6  %    HC/AC:      1.06        1.06 -
 AC:      174.8  mm     G. Age:  22w 3d         55  %    FL/BPD:     70.9   %    71 - 87
 FL:       35.1  mm     G. Age:  21w 0d         15  %    FL/AC:      20.1   %    20 - 24
 HUM:      32.4  mm     G. Age:  20w 6d         21  %
 CER:      20.8  mm     G. Age:  19w 6d        < 5  %
 LV:        5.8  mm
 CM:        4.2  mm

 Est. FW:     443  gm           1 lb     37  %
OB History

 Gravidity:    2         Term:   1
Gestational Age

 LMP:           22w 0d        Date:  11/27/17                 EDD:   09/03/18
 U/S Today:     21w 2d                                        EDD:   09/08/18
 Best:          22w 0d     Det. By:  LMP  (11/27/17)          EDD:   09/03/18
Anatomy

 Cranium:               Appears normal         Aortic Arch:            Appears normal
 Cavum:                 Appears normal         Ductal Arch:            Appears normal
 Ventricles:            Appears normal         Diaphragm:              Appears normal
 Choroid Plexus:        Appears normal         Stomach:                Appears normal, left
                                                                       sided
 Cerebellum:            Appears normal         Abdomen:                Appears normal
 Posterior Fossa:       Appears normal         Abdominal Wall:         Appears nml (cord
                                                                       insert, abd wall)
 Nuchal Fold:           Not applicable (>20    Cord Vessels:           Appears normal (3
                        wks GA)                                        vessel cord)
 Face:                  Appears normal         Kidneys:                Appear normal
                        (orbits and profile)
 Lips:                  Appears normal         Bladder:                Appears normal
 Thoracic:              Appears normal         Spine:                  Appears normal
 Heart:                 Appears normal         Upper Extremities:      Appears normal
                        (4CH, axis, and
                        situs)
 RVOT:                  Appears normal         Lower Extremities:      Appears normal
 LVOT:                  Appears normal

 Other:  Fetus appears to be female. Nasal bone visualized. Heels and feet
         visualized. Technically difficult due to fetal position.
Cervix Uterus Adnexa

 Cervix
 Length:           5.04  cm.
 Normal appearance by transabdominal scan.

 Uterus
 No abnormality visualized.

 Left Ovary
 Within normal limits.
 Right Ovary
 Within normal limits.

 Adnexa
 No abnormality visualized.
Impression

 Ms. Sembery is here for a second opinion. On your office
 ultrasound, hyperechogenic bowel was suspected.
 On first-trimester screening, the risk for Down syndrome was
 not increased. MSAFP screening showed low risk for open-
 neural tube defects.
 We performed a fetal anatomy scan. No markers of
 aneuploidies or fetal structural defects are seen. Fetal bowel
 look normal with no hyperechogenicity. Fetal biometry is
 consistent with her previously-established dates. Amniotic
 fluid is normal and good fetal activity is seen. Patient
 understands the limitations of ultrasound in detecting fetal
 anomalies.

 I reassured the patient of the findings.
Recommendations

 Follow-up as clinically indicated.
                 Moorthi, Leonito

## 2020-02-26 DIAGNOSIS — L709 Acne, unspecified: Secondary | ICD-10-CM | POA: Diagnosis not present

## 2020-02-26 DIAGNOSIS — B001 Herpesviral vesicular dermatitis: Secondary | ICD-10-CM | POA: Diagnosis not present

## 2020-05-05 DIAGNOSIS — Z01419 Encounter for gynecological examination (general) (routine) without abnormal findings: Secondary | ICD-10-CM | POA: Diagnosis not present

## 2020-05-05 DIAGNOSIS — Z6835 Body mass index (BMI) 35.0-35.9, adult: Secondary | ICD-10-CM | POA: Diagnosis not present

## 2020-05-05 DIAGNOSIS — Z124 Encounter for screening for malignant neoplasm of cervix: Secondary | ICD-10-CM | POA: Diagnosis not present

## 2020-09-07 DIAGNOSIS — D225 Melanocytic nevi of trunk: Secondary | ICD-10-CM | POA: Diagnosis not present

## 2020-09-07 DIAGNOSIS — L700 Acne vulgaris: Secondary | ICD-10-CM | POA: Diagnosis not present

## 2020-09-07 DIAGNOSIS — D2271 Melanocytic nevi of right lower limb, including hip: Secondary | ICD-10-CM | POA: Diagnosis not present

## 2020-09-07 DIAGNOSIS — D485 Neoplasm of uncertain behavior of skin: Secondary | ICD-10-CM | POA: Diagnosis not present

## 2020-09-07 DIAGNOSIS — L739 Follicular disorder, unspecified: Secondary | ICD-10-CM | POA: Diagnosis not present

## 2020-10-08 DIAGNOSIS — D229 Melanocytic nevi, unspecified: Secondary | ICD-10-CM | POA: Diagnosis not present

## 2020-12-10 DIAGNOSIS — L739 Follicular disorder, unspecified: Secondary | ICD-10-CM | POA: Diagnosis not present

## 2020-12-10 DIAGNOSIS — L700 Acne vulgaris: Secondary | ICD-10-CM | POA: Diagnosis not present

## 2021-04-27 DIAGNOSIS — M62830 Muscle spasm of back: Secondary | ICD-10-CM | POA: Diagnosis not present

## 2021-04-27 DIAGNOSIS — M545 Low back pain, unspecified: Secondary | ICD-10-CM | POA: Diagnosis not present

## 2021-04-27 DIAGNOSIS — G8929 Other chronic pain: Secondary | ICD-10-CM | POA: Diagnosis not present

## 2021-05-10 DIAGNOSIS — Z01411 Encounter for gynecological examination (general) (routine) with abnormal findings: Secondary | ICD-10-CM | POA: Diagnosis not present

## 2021-05-10 DIAGNOSIS — Z01419 Encounter for gynecological examination (general) (routine) without abnormal findings: Secondary | ICD-10-CM | POA: Diagnosis not present

## 2021-05-10 DIAGNOSIS — Z0142 Encounter for cervical smear to confirm findings of recent normal smear following initial abnormal smear: Secondary | ICD-10-CM | POA: Diagnosis not present

## 2021-05-10 DIAGNOSIS — Z124 Encounter for screening for malignant neoplasm of cervix: Secondary | ICD-10-CM | POA: Diagnosis not present

## 2021-05-10 DIAGNOSIS — Z6835 Body mass index (BMI) 35.0-35.9, adult: Secondary | ICD-10-CM | POA: Diagnosis not present

## 2021-09-06 DIAGNOSIS — L578 Other skin changes due to chronic exposure to nonionizing radiation: Secondary | ICD-10-CM | POA: Diagnosis not present

## 2021-09-06 DIAGNOSIS — L700 Acne vulgaris: Secondary | ICD-10-CM | POA: Diagnosis not present

## 2021-09-06 DIAGNOSIS — D2271 Melanocytic nevi of right lower limb, including hip: Secondary | ICD-10-CM | POA: Diagnosis not present

## 2021-09-06 DIAGNOSIS — D225 Melanocytic nevi of trunk: Secondary | ICD-10-CM | POA: Diagnosis not present

## 2021-10-29 DIAGNOSIS — Z13220 Encounter for screening for lipoid disorders: Secondary | ICD-10-CM | POA: Diagnosis not present

## 2021-10-29 DIAGNOSIS — Z0000 Encounter for general adult medical examination without abnormal findings: Secondary | ICD-10-CM | POA: Diagnosis not present

## 2021-10-29 DIAGNOSIS — Z79899 Other long term (current) drug therapy: Secondary | ICD-10-CM | POA: Diagnosis not present

## 2021-10-29 DIAGNOSIS — Z130 Encounter for screening for diseases of the blood and blood-forming organs and certain disorders involving the immune mechanism: Secondary | ICD-10-CM | POA: Diagnosis not present

## 2021-11-14 DIAGNOSIS — J069 Acute upper respiratory infection, unspecified: Secondary | ICD-10-CM | POA: Diagnosis not present

## 2021-12-07 DIAGNOSIS — L700 Acne vulgaris: Secondary | ICD-10-CM | POA: Diagnosis not present

## 2022-04-04 DIAGNOSIS — L700 Acne vulgaris: Secondary | ICD-10-CM | POA: Diagnosis not present

## 2022-05-13 DIAGNOSIS — Z01419 Encounter for gynecological examination (general) (routine) without abnormal findings: Secondary | ICD-10-CM | POA: Diagnosis not present

## 2022-09-08 DIAGNOSIS — D2271 Melanocytic nevi of right lower limb, including hip: Secondary | ICD-10-CM | POA: Diagnosis not present

## 2022-09-08 DIAGNOSIS — D225 Melanocytic nevi of trunk: Secondary | ICD-10-CM | POA: Diagnosis not present

## 2022-09-08 DIAGNOSIS — Z86018 Personal history of other benign neoplasm: Secondary | ICD-10-CM | POA: Diagnosis not present

## 2022-09-08 DIAGNOSIS — L814 Other melanin hyperpigmentation: Secondary | ICD-10-CM | POA: Diagnosis not present

## 2022-11-01 DIAGNOSIS — J40 Bronchitis, not specified as acute or chronic: Secondary | ICD-10-CM | POA: Diagnosis not present

## 2022-11-01 DIAGNOSIS — R051 Acute cough: Secondary | ICD-10-CM | POA: Diagnosis not present

## 2022-11-10 DIAGNOSIS — D7589 Other specified diseases of blood and blood-forming organs: Secondary | ICD-10-CM | POA: Diagnosis not present

## 2022-11-10 DIAGNOSIS — E559 Vitamin D deficiency, unspecified: Secondary | ICD-10-CM | POA: Diagnosis not present

## 2022-11-10 DIAGNOSIS — Z13220 Encounter for screening for lipoid disorders: Secondary | ICD-10-CM | POA: Diagnosis not present

## 2022-11-10 DIAGNOSIS — Z79899 Other long term (current) drug therapy: Secondary | ICD-10-CM | POA: Diagnosis not present

## 2022-11-10 DIAGNOSIS — Z0000 Encounter for general adult medical examination without abnormal findings: Secondary | ICD-10-CM | POA: Diagnosis not present

## 2022-11-10 DIAGNOSIS — Z130 Encounter for screening for diseases of the blood and blood-forming organs and certain disorders involving the immune mechanism: Secondary | ICD-10-CM | POA: Diagnosis not present

## 2023-06-30 DIAGNOSIS — L700 Acne vulgaris: Secondary | ICD-10-CM | POA: Diagnosis not present

## 2023-06-30 DIAGNOSIS — Z3041 Encounter for surveillance of contraceptive pills: Secondary | ICD-10-CM | POA: Diagnosis not present

## 2023-08-17 DIAGNOSIS — Z1331 Encounter for screening for depression: Secondary | ICD-10-CM | POA: Diagnosis not present

## 2023-08-17 DIAGNOSIS — Z01419 Encounter for gynecological examination (general) (routine) without abnormal findings: Secondary | ICD-10-CM | POA: Diagnosis not present

## 2023-11-23 DIAGNOSIS — D7589 Other specified diseases of blood and blood-forming organs: Secondary | ICD-10-CM | POA: Diagnosis not present

## 2023-11-23 DIAGNOSIS — Z0000 Encounter for general adult medical examination without abnormal findings: Secondary | ICD-10-CM | POA: Diagnosis not present

## 2023-11-23 DIAGNOSIS — E559 Vitamin D deficiency, unspecified: Secondary | ICD-10-CM | POA: Diagnosis not present

## 2023-11-23 DIAGNOSIS — Z79899 Other long term (current) drug therapy: Secondary | ICD-10-CM | POA: Diagnosis not present

## 2023-11-23 DIAGNOSIS — E785 Hyperlipidemia, unspecified: Secondary | ICD-10-CM | POA: Diagnosis not present
